# Patient Record
Sex: Female | Born: 1935 | Race: White | Hispanic: No | Smoking: Former smoker
Health system: Southern US, Community
[De-identification: ages and names within clinical notes are randomized; demographics above are authoritative.]

## PROBLEM LIST (undated history)

## (undated) DIAGNOSIS — F0390 Unspecified dementia without behavioral disturbance: Secondary | ICD-10-CM

## (undated) DIAGNOSIS — I10 Essential (primary) hypertension: Secondary | ICD-10-CM

## (undated) DIAGNOSIS — H409 Unspecified glaucoma: Secondary | ICD-10-CM

## (undated) DIAGNOSIS — F419 Anxiety disorder, unspecified: Secondary | ICD-10-CM

## (undated) DIAGNOSIS — M1990 Unspecified osteoarthritis, unspecified site: Secondary | ICD-10-CM

## (undated) HISTORY — PX: JOINT REPLACEMENT: SHX530

---

## 1998-01-25 ENCOUNTER — Other Ambulatory Visit: Admission: RE | Admit: 1998-01-25 | Discharge: 1998-01-25 | Payer: Self-pay | Admitting: Internal Medicine

## 2000-01-17 ENCOUNTER — Other Ambulatory Visit: Admission: RE | Admit: 2000-01-17 | Discharge: 2000-01-17 | Payer: Self-pay | Admitting: Internal Medicine

## 2000-06-13 ENCOUNTER — Ambulatory Visit (HOSPITAL_COMMUNITY): Admission: RE | Admit: 2000-06-13 | Discharge: 2000-06-13 | Payer: Self-pay | Admitting: *Deleted

## 2000-06-13 ENCOUNTER — Encounter (INDEPENDENT_AMBULATORY_CARE_PROVIDER_SITE_OTHER): Payer: Self-pay | Admitting: *Deleted

## 2002-05-10 ENCOUNTER — Encounter: Admission: RE | Admit: 2002-05-10 | Discharge: 2002-05-10 | Payer: Self-pay | Admitting: Internal Medicine

## 2002-05-10 ENCOUNTER — Encounter: Payer: Self-pay | Admitting: Internal Medicine

## 2002-06-08 ENCOUNTER — Encounter: Admission: RE | Admit: 2002-06-08 | Discharge: 2002-06-08 | Payer: Self-pay | Admitting: Obstetrics and Gynecology

## 2002-06-08 ENCOUNTER — Encounter: Payer: Self-pay | Admitting: Obstetrics and Gynecology

## 2002-07-05 ENCOUNTER — Encounter: Payer: Self-pay | Admitting: Internal Medicine

## 2002-07-05 ENCOUNTER — Encounter: Admission: RE | Admit: 2002-07-05 | Discharge: 2002-07-05 | Payer: Self-pay | Admitting: Internal Medicine

## 2003-06-14 ENCOUNTER — Encounter: Admission: RE | Admit: 2003-06-14 | Discharge: 2003-06-14 | Payer: Self-pay | Admitting: Internal Medicine

## 2004-06-19 ENCOUNTER — Encounter: Admission: RE | Admit: 2004-06-19 | Discharge: 2004-06-19 | Payer: Self-pay | Admitting: Family Medicine

## 2004-08-30 ENCOUNTER — Other Ambulatory Visit: Admission: RE | Admit: 2004-08-30 | Discharge: 2004-08-30 | Payer: Self-pay | Admitting: Obstetrics and Gynecology

## 2005-06-20 ENCOUNTER — Encounter: Admission: RE | Admit: 2005-06-20 | Discharge: 2005-06-20 | Payer: Self-pay | Admitting: Family Medicine

## 2005-10-31 ENCOUNTER — Encounter: Admission: RE | Admit: 2005-10-31 | Discharge: 2005-10-31 | Payer: Self-pay | Admitting: *Deleted

## 2006-07-08 ENCOUNTER — Encounter: Admission: RE | Admit: 2006-07-08 | Discharge: 2006-07-08 | Payer: Self-pay | Admitting: *Deleted

## 2007-07-14 ENCOUNTER — Encounter: Admission: RE | Admit: 2007-07-14 | Discharge: 2007-07-14 | Payer: Self-pay | Admitting: Family

## 2009-06-09 ENCOUNTER — Encounter: Admission: RE | Admit: 2009-06-09 | Discharge: 2009-06-09 | Payer: Self-pay | Admitting: Family Medicine

## 2009-06-09 ENCOUNTER — Encounter (INDEPENDENT_AMBULATORY_CARE_PROVIDER_SITE_OTHER): Payer: Self-pay | Admitting: *Deleted

## 2010-05-03 NOTE — Procedures (Signed)
Summary: Colon   Colonoscopy  Procedure date:  06/13/2000  Findings:      Location:  [facility].                     [facility]  Patient:    Yvonne Beck, Yvonne Beck                        MRN: [id number] Adm. Date:  [id number] Attending:  Sabino Gasser                           Procedure Report  PROCEDURE:  Colonoscopy.  INDICATIONS:  Colon cancer screening.  ANESTHESIA:  Demerol 60 mg, Versed 6 mg were given intravenously in divided dose.  DESCRIPTION OF PROCEDURE:  With patient mildly sedated in the left lateral decubitus position, the Olympus videoscopic colonoscope was passed under direct vision after being inserted into the rectum.  We reached the cecum, cecum identified by the ileocecal valve and appendiceal orifice.  From this point, the colonoscope was slowly withdrawn, taking circumferential views of the entire colonic mucosa, stopping then only in the rectum, which appeared normal on direct view and showed internal hemorrhoids on retroflexed view. The endoscope was straightened and withdrawn.  The patients vital signs and pulse oximetry remained stable.  The patient tolerated the procedure well without apparent complications.  FINDINGS:  Internal hemorrhoids, otherwise unremarkable examination.  PLAN:  Follow up with me in five years or as needed. DD:  06/13/00 TD:  06/13/00 Job: [id number]    This report was created from the original endoscopy report, which was reviewed and signed by the above listed endoscopist.

## 2010-08-17 NOTE — Procedures (Signed)
[facility]  Patient:    Yvonne Beck, Yvonne Beck                        MRN: [id number] Adm. Date:  [id number] Attending:  Sabino Gasser                           Procedure Report  PROCEDURE:  Colonoscopy.  INDICATIONS:  Colon cancer screening.  ANESTHESIA:  Demerol 60 mg, Versed 6 mg were given intravenously in divided dose.  DESCRIPTION OF PROCEDURE:  With patient mildly sedated in the left lateral decubitus position, the Olympus videoscopic colonoscope was passed under direct vision after being inserted into the rectum.  We reached the cecum, cecum identified by the ileocecal valve and appendiceal orifice.  From this point, the colonoscope was slowly withdrawn, taking circumferential views of the entire colonic mucosa, stopping then only in the rectum, which appeared normal on direct view and showed internal hemorrhoids on retroflexed view. The endoscope was straightened and withdrawn.  The patients vital signs and pulse oximetry remained stable.  The patient tolerated the procedure well without apparent complications.  FINDINGS:  Internal hemorrhoids, otherwise unremarkable examination.  PLAN:  Follow up with me in five years or as needed. DD:  06/13/00 TD:  06/13/00 Job: [id number]

## 2011-12-08 ENCOUNTER — Emergency Department (HOSPITAL_COMMUNITY)
Admission: EM | Admit: 2011-12-08 | Discharge: 2011-12-08 | Disposition: A | Discharge status: Home / Self Care | Payer: Medicare HMO | Attending: Emergency Medicine | Admitting: Emergency Medicine

## 2011-12-08 ENCOUNTER — Encounter (HOSPITAL_COMMUNITY): Payer: Self-pay | Admitting: *Deleted

## 2011-12-08 DIAGNOSIS — S41119A Laceration without foreign body of unspecified upper arm, initial encounter: Secondary | ICD-10-CM

## 2011-12-08 DIAGNOSIS — W2209XA Striking against other stationary object, initial encounter: Secondary | ICD-10-CM | POA: Insufficient documentation

## 2011-12-08 DIAGNOSIS — S51809A Unspecified open wound of unspecified forearm, initial encounter: Secondary | ICD-10-CM | POA: Insufficient documentation

## 2011-12-08 HISTORY — DX: Essential (primary) hypertension: I10

## 2011-12-08 NOTE — ED Notes (Signed)
Pt alert and oriented x4. Respirations even and unlabored, bilateral symmetrical rise and fall of chest. Skin warm and dry. In no acute distress. Denies needs.   

## 2011-12-08 NOTE — ED Provider Notes (Addendum)
History     CSN: [id number]  Arrival date & time 12/08/11  1706   First MD Initiated Contact with Patient 12/08/11 1851      Chief Complaint  Patient presents with  . Fall  . Arm Injury     HPI The patient presents the emergency room after injuring her right arm. Patient states she fell and dragged her right forearm on the window seal. She sustained a small laceration.  Patient states is only sore denies any numbness or weakness. She does not feel weak or dizzy. She did not strike her head or lose consciousness. She has not had any fevers or cough no chest pain or abdominal pain. Patient states she otherwise feels fine Past Medical History  Diagnosis Date  . Hypertension     History reviewed. No pertinent past surgical history.  History reviewed. No pertinent family history.  History  Substance Use Topics  . Smoking status: Former Games developer  . Smokeless tobacco: Not on file  . Alcohol Use: No    OB History    Grav Para Term Preterm Abortions TAB SAB Ect Mult Living                  Review of Systems  All other systems reviewed and are negative.    Allergies  Review of patient's allergies indicates no known allergies.  Home Medications   Current Outpatient Rx  Name Route Sig Dispense Refill  . ASPIRIN EC 81 MG PO TBEC Oral Take 81 mg by mouth daily.      BP 191/83  Pulse 80  Temp 98.1 F (36.7 C) (Oral)  SpO2 94%  Physical Exam  Nursing note and vitals reviewed. Constitutional: She appears well-developed and well-nourished. No distress.  HENT:  Head: Normocephalic and atraumatic.  Right Ear: External ear normal.  Left Ear: External ear normal.  Eyes: Conjunctivae are normal. Right eye exhibits no discharge. Left eye exhibits no discharge. No scleral icterus.  Neck: Neck supple. No tracheal deviation present.  Cardiovascular: Normal rate, regular rhythm and intact distal pulses.   Pulmonary/Chest: Effort normal and breath sounds normal. No stridor. No  respiratory distress. She has no wheezes. She has no rales.  Abdominal: Soft. Bowel sounds are normal. She exhibits no distension. There is no tenderness. There is no rebound and no guarding.  Musculoskeletal: She exhibits no edema and no tenderness.       Superficial laceration right mid forearm dorsal aspect, distal neurovascular intact, and appears to be superficial  Neurological: She is alert. She has normal strength. No sensory deficit. Cranial nerve deficit:  no gross defecits noted. She exhibits normal muscle tone. She displays no seizure activity. Coordination normal.  Skin: Skin is warm and dry. No rash noted.  Psychiatric: She has a normal mood and affect.    ED Course  LACERATION REPAIR Performed by: Celene Kras Authorized by: Linwood Dibbles R Consent: Verbal consent obtained. Body area: upper extremity Location details: right lower arm Laceration length: 5 cm Tendon involvement: none Nerve involvement: none Vascular damage: no Irrigation solution: saline Amount of cleaning: standard Debridement: none Degree of undermining: none Skin closure: glue Approximation: close Approximation difficulty: simple   No results found.   1. Laceration of arm       MDM  Patient without signs of serious injury. Her laceration was repaired with Dermabond. The patient denies any significant bony tenderness. X-rays deferred  At this time there does not appear to be any evidence of an  acute emergency medical condition and the patient appears stable for discharge with appropriate outpatient follow up.   Celene Kras, MD 12/08/11 Rosamaria Lints

## 2011-12-08 NOTE — ED Notes (Addendum)
Pt reports she was standing and she fell onto her window sill. Pt has fallen 3 times before. Pt fell and dragged right forearm on window seal. Pt has 5 inch vertical laceration down outside of forearm. Pt reports "arm is sore" but will not give a number for pain. Bruising noted along forearm. Pt denies numbness/ tingling. Radial pulse bil strong. Full rom.

## 2012-01-28 ENCOUNTER — Encounter (HOSPITAL_COMMUNITY): Payer: Self-pay | Admitting: *Deleted

## 2012-01-29 ENCOUNTER — Encounter (HOSPITAL_COMMUNITY): Payer: Self-pay | Admitting: Pharmacy Technician

## 2012-02-09 NOTE — H&P (Signed)
TOTAL HIP ADMISSION H&P  Patient is admitted for right total hip arthroplasty.  Subjective:  Chief Complaint: right hip pain  HPI: Yvonne Beck, 78 y.o. female, has a history of pain and functional disability in the right hip(s) due to arthritis and patient has failed non-surgical conservative treatments for greater than 12 weeks to include NSAID's and/or analgesics, use of assistive devices and activity modification.  Onset of symptoms was gradual starting 2 years ago with gradually worsening course since that time.The patient noted no past surgery on the right hip(s).  Patient currently rates pain in the right hip at 8 out of 10 with activity. Patient has night pain, worsening of pain with activity and weight bearing, pain that interfers with activities of daily living and pain with passive range of motion. Patient has evidence of subchondral cysts, subchondral sclerosis, periarticular osteophytes and joint space narrowing by imaging studies. This condition presents safety issues increasing the risk of falls. There is no current active infection.   Past Medical History  Diagnosis Date  . Hypertension   . Arthritis   Glaucoma Measles Mumps Scarlet fever  Past Surgical History  Procedure Date  . No past surgeries     Current outpatient prescriptions:aspirin EC 81 MG tablet, Take 81 mg by mouth daily.,    fluticasone (FLONASE) 50 MCG/ACT nasal spray, Place 2 sprays into the nose daily.,    latanoprost (XALATAN) 0.005 % ophthalmic solution, Place 1 drop into the right eye every morning., lisinopril (PRINIVIL,ZESTRIL) 40 MG tablet, Take 40 mg by mouth daily.,  timolol (TIMOPTIC-XR) 0.25 % ophthalmic gel-forming, Place 1 drop into the right eye every morning.,    No Known Allergies  History  Substance Use Topics  . Smoking status: Former Games developer  . Smokeless tobacco: Not on file  . Alcohol Use: No    Family History Mother deceased in her 35s from a stroke Father deceased age 3  from heart failure Sister deceased age 35 from a stroke  Review of Systems  Constitutional: Positive for malaise/fatigue. Negative for fever, chills, weight loss and diaphoresis.  HENT: Negative.  Negative for neck pain.   Eyes: Negative.   Respiratory: Negative.   Cardiovascular: Negative.   Gastrointestinal: Negative.   Genitourinary: Positive for frequency. Negative for dysuria, urgency, hematuria and flank pain.       At night  Musculoskeletal: Positive for joint pain. Negative for myalgias, back pain and falls.       Right hip pain  Skin: Negative.   Neurological: Negative.  Negative for weakness.  Endo/Heme/Allergies: Negative.   Psychiatric/Behavioral: Negative.     Objective:  Physical Exam  Constitutional: She is oriented to person, place, and time. She appears well-developed and well-nourished. No distress.  HENT:  Head: Normocephalic and atraumatic.  Right Ear: External ear normal.  Left Ear: External ear normal.  Nose: Nose normal.  Mouth/Throat: Oropharynx is clear and moist.  Eyes: EOM are normal.  Neck: Normal range of motion. Neck supple. No thyromegaly present.  Cardiovascular: Normal rate, regular rhythm, normal heart sounds and intact distal pulses.   No murmur heard. Respiratory: Effort normal. No respiratory distress. She has wheezes. She exhibits no tenderness.       Mild inspiratory wheeze in all lung fields  GI: Soft. Bowel sounds are normal. She exhibits no distension and no mass. There is no tenderness.  Musculoskeletal:       Right hip: She exhibits decreased range of motion. She exhibits no tenderness.  Left hip: Normal.       Right knee: Normal.       Left knee: Normal.       Legs: Lymphadenopathy:    She has no cervical adenopathy.  Neurological: She is alert and oriented to person, place, and time. She has normal strength. No sensory deficit.  Skin: No rash noted. She is not diaphoretic. No erythema.  Psychiatric: She has a normal  mood and affect.    Vitals BP: 140/80 HR: 64 bpm RR: 16   Imaging Review Plain radiographs demonstrate severe degenerative joint disease of the right hip(s). The bone quality appears to be fair for age and reported activity level.  Assessment/Plan:  End stage arthritis, right hip(s)  The patient history, physical examination, clinical judgement of the provider and imaging studies are consistent with end stage degenerative joint disease of the right hip(s) and total hip arthroplasty is deemed medically necessary. The treatment options including medical management, injection therapy, arthroscopy and arthroplasty were discussed at length. The risks and benefits of total hip arthroplasty were presented and reviewed. The risks due to aseptic loosening, infection, stiffness, dislocation/subluxation,  thromboembolic complications and other imponderables were discussed.  The patient acknowledged the explanation, agreed to proceed with the plan and consent was signed. Patient is being admitted for inpatient treatment for surgery, pain control, PT, OT, prophylactic antibiotics, VTE prophylaxis, progressive ambulation and ADL's and discharge planning.The patient is planning to be discharged home with home health services.     Dimitri Ped, PA-C

## 2012-02-10 ENCOUNTER — Encounter (HOSPITAL_COMMUNITY): Payer: Self-pay | Admitting: Anesthesiology

## 2012-02-10 ENCOUNTER — Inpatient Hospital Stay (HOSPITAL_COMMUNITY): Payer: Medicare HMO | Admitting: Anesthesiology

## 2012-02-10 ENCOUNTER — Inpatient Hospital Stay (HOSPITAL_COMMUNITY): Payer: Medicare HMO

## 2012-02-10 ENCOUNTER — Encounter (HOSPITAL_COMMUNITY): Payer: Self-pay | Admitting: *Deleted

## 2012-02-10 ENCOUNTER — Inpatient Hospital Stay (HOSPITAL_COMMUNITY)
Admission: RE | Admit: 2012-02-10 | Discharge: 2012-02-14 | DRG: 470 | Disposition: A | Discharge status: Skilled Nursing Facility | Payer: Medicare HMO | Source: Ambulatory Visit | Attending: Orthopedic Surgery | Admitting: Orthopedic Surgery

## 2012-02-10 ENCOUNTER — Encounter (HOSPITAL_COMMUNITY)
Admission: RE | Disposition: A | Discharge status: Skilled Nursing Facility | Payer: Self-pay | Source: Ambulatory Visit | Attending: Orthopedic Surgery

## 2012-02-10 DIAGNOSIS — D62 Acute posthemorrhagic anemia: Secondary | ICD-10-CM | POA: Diagnosis not present

## 2012-02-10 DIAGNOSIS — M87059 Idiopathic aseptic necrosis of unspecified femur: Secondary | ICD-10-CM | POA: Diagnosis present

## 2012-02-10 DIAGNOSIS — Z96649 Presence of unspecified artificial hip joint: Secondary | ICD-10-CM

## 2012-02-10 DIAGNOSIS — R339 Retention of urine, unspecified: Secondary | ICD-10-CM | POA: Diagnosis not present

## 2012-02-10 DIAGNOSIS — I10 Essential (primary) hypertension: Secondary | ICD-10-CM | POA: Diagnosis present

## 2012-02-10 HISTORY — PX: TOTAL HIP ARTHROPLASTY: SHX124

## 2012-02-10 HISTORY — DX: Unspecified osteoarthritis, unspecified site: M19.90

## 2012-02-10 LAB — SURGICAL PCR SCREEN: MRSA, PCR: NEGATIVE

## 2012-02-10 LAB — PROTIME-INR
INR: 1.01 (ref 0.00–1.49)
Prothrombin Time: 13.2 seconds (ref 11.6–15.2)

## 2012-02-10 LAB — ABO/RH: ABO/RH(D): B POS

## 2012-02-10 LAB — APTT: aPTT: 26 seconds (ref 24–37)

## 2012-02-10 LAB — TYPE AND SCREEN
ABO/RH(D): B POS
Antibody Screen: NEGATIVE

## 2012-02-10 SURGERY — ARTHROPLASTY, HIP, TOTAL,POSTERIOR APPROACH
Anesthesia: General | Site: Hip | Laterality: Right | Wound class: Clean

## 2012-02-10 MED ORDER — HYDROMORPHONE HCL PF 1 MG/ML IJ SOLN
INTRAMUSCULAR | Status: DC | PRN
  Administered 2012-02-10 (×3): 0.5 mg via INTRAVENOUS

## 2012-02-10 MED ORDER — BISACODYL 10 MG RE SUPP
10.0000 mg | Freq: Every day | RECTAL | Status: DC | PRN
  Administered 2012-02-14: 10 mg via RECTAL
  Filled 2012-02-10: qty 1

## 2012-02-10 MED ORDER — SODIUM CHLORIDE 0.9 % IR SOLN
Status: DC | PRN
  Administered 2012-02-10: 1000 mL

## 2012-02-10 MED ORDER — HYDROMORPHONE HCL PF 1 MG/ML IJ SOLN
1.0000 mg | INTRAMUSCULAR | Status: DC | PRN
  Administered 2012-02-11: 1 mg via INTRAVENOUS
  Filled 2012-02-10: qty 1

## 2012-02-10 MED ORDER — ACETAMINOPHEN 10 MG/ML IV SOLN
INTRAVENOUS | Status: DC | PRN
  Administered 2012-02-10: 1000 mg via INTRAVENOUS

## 2012-02-10 MED ORDER — NEOSTIGMINE METHYLSULFATE 1 MG/ML IJ SOLN
INTRAMUSCULAR | Status: DC | PRN
  Administered 2012-02-10: 3 mg via INTRAVENOUS

## 2012-02-10 MED ORDER — FENTANYL CITRATE 0.05 MG/ML IJ SOLN
INTRAMUSCULAR | Status: DC | PRN
  Administered 2012-02-10: 50 ug via INTRAVENOUS
  Administered 2012-02-10 (×2): 25 ug via INTRAVENOUS
  Administered 2012-02-10 (×2): 50 ug via INTRAVENOUS
  Administered 2012-02-10 (×2): 25 ug via INTRAVENOUS

## 2012-02-10 MED ORDER — BUPIVACAINE LIPOSOME 1.3 % IJ SUSP
20.0000 mL | Freq: Once | INTRAMUSCULAR | Status: AC
  Administered 2012-02-10: 20 mL
  Filled 2012-02-10: qty 20

## 2012-02-10 MED ORDER — FLUTICASONE PROPIONATE 50 MCG/ACT NA SUSP
2.0000 | Freq: Every day | NASAL | Status: DC
  Administered 2012-02-11 – 2012-02-14 (×4): 2 via NASAL
  Filled 2012-02-10: qty 16

## 2012-02-10 MED ORDER — ONDANSETRON HCL 4 MG/2ML IJ SOLN
INTRAMUSCULAR | Status: DC | PRN
  Administered 2012-02-10: 2 mg via INTRAVENOUS

## 2012-02-10 MED ORDER — TIMOLOL MALEATE 0.25 % OP SOLG
1.0000 [drp] | Freq: Every morning | OPHTHALMIC | Status: DC
  Administered 2012-02-11 – 2012-02-14 (×4): 1 [drp] via OPHTHALMIC
  Filled 2012-02-10: qty 5

## 2012-02-10 MED ORDER — MUPIROCIN 2 % EX OINT: TOPICAL_OINTMENT | Freq: Two times a day (BID) | CUTANEOUS | Status: DC

## 2012-02-10 MED ORDER — GLYCOPYRROLATE 0.2 MG/ML IJ SOLN
INTRAMUSCULAR | Status: DC | PRN
  Administered 2012-02-10: 0.2 mg via INTRAVENOUS
  Administered 2012-02-10: 0.4 mg via INTRAVENOUS

## 2012-02-10 MED ORDER — METHOCARBAMOL 500 MG PO TABS
500.0000 mg | ORAL_TABLET | Freq: Four times a day (QID) | ORAL | Status: DC | PRN
  Administered 2012-02-11 – 2012-02-14 (×4): 500 mg via ORAL
  Filled 2012-02-10 (×4): qty 1

## 2012-02-10 MED ORDER — FLEET ENEMA 7-19 GM/118ML RE ENEM: 1.0000 | ENEMA | Freq: Once | RECTAL | Status: AC | PRN

## 2012-02-10 MED ORDER — LISINOPRIL 40 MG PO TABS
40.0000 mg | ORAL_TABLET | Freq: Every day | ORAL | Status: DC
  Administered 2012-02-11 – 2012-02-14 (×4): 40 mg via ORAL
  Filled 2012-02-10 (×4): qty 1

## 2012-02-10 MED ORDER — THROMBIN 5000 UNITS EX SOLR
OROMUCOSAL | Status: DC | PRN
  Administered 2012-02-10: 18:00:00 via TOPICAL

## 2012-02-10 MED ORDER — LIDOCAINE HCL (CARDIAC) 20 MG/ML IV SOLN
INTRAVENOUS | Status: DC | PRN
  Administered 2012-02-10: 20 mg via INTRAVENOUS

## 2012-02-10 MED ORDER — PHENYLEPHRINE HCL 10 MG/ML IJ SOLN
INTRAMUSCULAR | Status: DC | PRN
  Administered 2012-02-10 (×3): 20 ug via INTRAVENOUS

## 2012-02-10 MED ORDER — METHOCARBAMOL 100 MG/ML IJ SOLN
500.0000 mg | Freq: Four times a day (QID) | INTRAMUSCULAR | Status: DC | PRN
  Filled 2012-02-10: qty 5

## 2012-02-10 MED ORDER — LACTATED RINGERS IV SOLN
INTRAVENOUS | Status: DC
  Administered 2012-02-11 – 2012-02-12 (×3): via INTRAVENOUS

## 2012-02-10 MED ORDER — SODIUM CHLORIDE 0.9 % IR SOLN
Status: DC | PRN
  Administered 2012-02-10: 17:00:00

## 2012-02-10 MED ORDER — CISATRACURIUM BESYLATE (PF) 10 MG/5ML IV SOLN
INTRAVENOUS | Status: DC | PRN
  Administered 2012-02-10: 5 mg via INTRAVENOUS

## 2012-02-10 MED ORDER — ACETAMINOPHEN 325 MG PO TABS
650.0000 mg | ORAL_TABLET | Freq: Four times a day (QID) | ORAL | Status: DC | PRN
  Administered 2012-02-11 – 2012-02-12 (×2): 650 mg via ORAL
  Administered 2012-02-12: 325 mg via ORAL
  Administered 2012-02-13 – 2012-02-14 (×3): 650 mg via ORAL
  Filled 2012-02-10 (×3): qty 2
  Filled 2012-02-10: qty 1
  Filled 2012-02-10 (×2): qty 2

## 2012-02-10 MED ORDER — MEPERIDINE HCL 50 MG/ML IJ SOLN: 6.2500 mg | INTRAMUSCULAR | Status: DC | PRN

## 2012-02-10 MED ORDER — POLYETHYLENE GLYCOL 3350 17 G PO PACK: 17.0000 g | PACK | Freq: Every day | ORAL | Status: DC | PRN

## 2012-02-10 MED ORDER — CHLORHEXIDINE GLUCONATE 4 % EX LIQD
60.0000 mL | Freq: Once | CUTANEOUS | Status: DC
  Filled 2012-02-10: qty 60

## 2012-02-10 MED ORDER — LATANOPROST 0.005 % OP SOLN
1.0000 [drp] | Freq: Every morning | OPHTHALMIC | Status: DC
  Administered 2012-02-11 – 2012-02-14 (×4): 1 [drp] via OPHTHALMIC
  Filled 2012-02-10: qty 2.5

## 2012-02-10 MED ORDER — FERROUS SULFATE 325 (65 FE) MG PO TABS
325.0000 mg | ORAL_TABLET | Freq: Three times a day (TID) | ORAL | Status: DC
  Administered 2012-02-11 – 2012-02-14 (×9): 325 mg via ORAL
  Filled 2012-02-10 (×13): qty 1

## 2012-02-10 MED ORDER — EPHEDRINE SULFATE 50 MG/ML IJ SOLN
INTRAMUSCULAR | Status: DC | PRN
  Administered 2012-02-10 (×3): 5 mg via INTRAVENOUS

## 2012-02-10 MED ORDER — ACETAMINOPHEN 650 MG RE SUPP: 650.0000 mg | Freq: Four times a day (QID) | RECTAL | Status: DC | PRN

## 2012-02-10 MED ORDER — ESMOLOL HCL 10 MG/ML IV SOLN
INTRAVENOUS | Status: DC | PRN
  Administered 2012-02-10 (×2): 10 mg via INTRAVENOUS

## 2012-02-10 MED ORDER — SUCCINYLCHOLINE CHLORIDE 20 MG/ML IJ SOLN
INTRAMUSCULAR | Status: DC | PRN
  Administered 2012-02-10: 100 mg via INTRAVENOUS

## 2012-02-10 MED ORDER — PHENOL 1.4 % MT LIQD
1.0000 | OROMUCOSAL | Status: DC | PRN
  Filled 2012-02-10: qty 177

## 2012-02-10 MED ORDER — MUPIROCIN 2 % EX OINT
TOPICAL_OINTMENT | CUTANEOUS | Status: AC
  Administered 2012-02-10: 1 via NASAL
  Filled 2012-02-10: qty 22

## 2012-02-10 MED ORDER — LACTATED RINGERS IV SOLN
INTRAVENOUS | Status: DC | PRN
  Administered 2012-02-10 (×2): via INTRAVENOUS

## 2012-02-10 MED ORDER — ONDANSETRON HCL 4 MG PO TABS: 4.0000 mg | ORAL_TABLET | Freq: Four times a day (QID) | ORAL | Status: DC | PRN

## 2012-02-10 MED ORDER — OXYCODONE-ACETAMINOPHEN 5-325 MG PO TABS
2.0000 | ORAL_TABLET | ORAL | Status: DC | PRN
  Administered 2012-02-12 – 2012-02-13 (×2): 2 via ORAL
  Filled 2012-02-10 (×3): qty 2

## 2012-02-10 MED ORDER — HYDROCODONE-ACETAMINOPHEN 5-325 MG PO TABS
1.0000 | ORAL_TABLET | ORAL | Status: DC | PRN
  Administered 2012-02-11 (×3): 1 via ORAL
  Filled 2012-02-10 (×3): qty 1

## 2012-02-10 MED ORDER — HETASTARCH-ELECTROLYTES 6 % IV SOLN
INTRAVENOUS | Status: DC | PRN
  Administered 2012-02-10: 18:00:00 via INTRAVENOUS

## 2012-02-10 MED ORDER — OXYCODONE HCL 5 MG PO TABS: 5.0000 mg | ORAL_TABLET | Freq: Once | ORAL | Status: DC | PRN

## 2012-02-10 MED ORDER — BACITRACIN ZINC 500 UNIT/GM EX OINT
TOPICAL_OINTMENT | CUTANEOUS | Status: DC | PRN
  Administered 2012-02-10: 1 via TOPICAL

## 2012-02-10 MED ORDER — CEFAZOLIN SODIUM 1-5 GM-% IV SOLN
1.0000 g | Freq: Four times a day (QID) | INTRAVENOUS | Status: AC
  Administered 2012-02-10 – 2012-02-11 (×2): 1 g via INTRAVENOUS
  Filled 2012-02-10 (×2): qty 50

## 2012-02-10 MED ORDER — HYDROMORPHONE HCL PF 1 MG/ML IJ SOLN
0.2500 mg | INTRAMUSCULAR | Status: DC | PRN
  Administered 2012-02-10 (×2): 0.5 mg via INTRAVENOUS

## 2012-02-10 MED ORDER — CEFAZOLIN SODIUM-DEXTROSE 2-3 GM-% IV SOLR
2.0000 g | INTRAVENOUS | Status: AC
  Administered 2012-02-10: 2 g via INTRAVENOUS

## 2012-02-10 MED ORDER — ONDANSETRON HCL 4 MG/2ML IJ SOLN
4.0000 mg | Freq: Four times a day (QID) | INTRAMUSCULAR | Status: DC | PRN
  Administered 2012-02-11: 4 mg via INTRAVENOUS
  Filled 2012-02-10: qty 2

## 2012-02-10 MED ORDER — LACTATED RINGERS IV SOLN
INTRAVENOUS | Status: DC
  Administered 2012-02-10: 1000 mL via INTRAVENOUS

## 2012-02-10 MED ORDER — MENTHOL 3 MG MT LOZG
1.0000 | LOZENGE | OROMUCOSAL | Status: DC | PRN
  Filled 2012-02-10: qty 9

## 2012-02-10 MED ORDER — PROPOFOL 10 MG/ML IV EMUL
INTRAVENOUS | Status: DC | PRN
  Administered 2012-02-10: 110 mg via INTRAVENOUS

## 2012-02-10 MED ORDER — RIVAROXABAN 10 MG PO TABS
10.0000 mg | ORAL_TABLET | Freq: Every day | ORAL | Status: DC
  Administered 2012-02-11 – 2012-02-12 (×2): 10 mg via ORAL
  Filled 2012-02-10 (×4): qty 1

## 2012-02-10 MED ORDER — ALUM & MAG HYDROXIDE-SIMETH 200-200-20 MG/5ML PO SUSP: 30.0000 mL | ORAL | Status: DC | PRN

## 2012-02-10 MED ORDER — PROMETHAZINE HCL 25 MG/ML IJ SOLN
6.2500 mg | INTRAMUSCULAR | Status: DC | PRN
Start: 2012-02-10 — End: 2012-02-10

## 2012-02-10 MED ORDER — OXYCODONE HCL 5 MG/5ML PO SOLN
5.0000 mg | Freq: Once | ORAL | Status: DC | PRN
  Filled 2012-02-10: qty 5

## 2012-02-10 MED ORDER — ACETAMINOPHEN 10 MG/ML IV SOLN: 1000.0000 mg | Freq: Once | INTRAVENOUS | Status: DC | PRN

## 2012-02-10 SURGICAL SUPPLY — 52 items
BAG SPEC THK2 15X12 ZIP CLS (MISCELLANEOUS) ×1
BAG ZIPLOCK 12X15 (MISCELLANEOUS) ×2 IMPLANT
BLADE SAW SAG 73X25 THK (BLADE) ×1
BLADE SAW SGTL 73X25 THK (BLADE) ×1 IMPLANT
CLOTH BEACON ORANGE TIMEOUT ST (SAFETY) ×2 IMPLANT
DRAPE INCISE IOBAN 66X45 STRL (DRAPES) ×2 IMPLANT
DRAPE INCISE IOBAN 85X60 (DRAPES) ×2 IMPLANT
DRAPE ORTHO SPLIT 77X108 STRL (DRAPES) ×4
DRAPE POUCH INSTRU U-SHP 10X18 (DRAPES) ×2 IMPLANT
DRAPE SURG 17X11 SM STRL (DRAPES) ×2 IMPLANT
DRAPE SURG ORHT 6 SPLT 77X108 (DRAPES) ×2 IMPLANT
DRAPE U-SHAPE 47X51 STRL (DRAPES) ×2 IMPLANT
DRSG EMULSION OIL 3X16 NADH (GAUZE/BANDAGES/DRESSINGS) ×2 IMPLANT
DRSG PAD ABDOMINAL 8X10 ST (GAUZE/BANDAGES/DRESSINGS) ×3 IMPLANT
DRSG VASELINE 3X18 (GAUZE/BANDAGES/DRESSINGS) ×1 IMPLANT
DURAPREP 26ML APPLICATOR (WOUND CARE) ×2 IMPLANT
ELECT BLADE TIP CTD 4 INCH (ELECTRODE) ×2 IMPLANT
ELECT REM PT RETURN 9FT ADLT (ELECTROSURGICAL) ×2
ELECTRODE REM PT RTRN 9FT ADLT (ELECTROSURGICAL) ×1 IMPLANT
EVACUATOR 1/8 PVC DRAIN (DRAIN) ×2 IMPLANT
FACESHIELD LNG OPTICON STERILE (SAFETY) ×12 IMPLANT
FLOSEAL 10ML (HEMOSTASIS) ×2 IMPLANT
GLOVE BIOGEL M 6.5 STRL (GLOVE) ×3 IMPLANT
GLOVE BIOGEL PI IND STRL 8 (GLOVE) ×1 IMPLANT
GLOVE BIOGEL PI IND STRL 8.5 (GLOVE) ×1 IMPLANT
GLOVE BIOGEL PI INDICATOR 8 (GLOVE) ×1
GLOVE BIOGEL PI INDICATOR 8.5 (GLOVE) ×1
GLOVE ECLIPSE 8.0 STRL XLNG CF (GLOVE) ×8 IMPLANT
GOWN PREVENTION PLUS LG XLONG (DISPOSABLE) ×5 IMPLANT
GOWN STRL REIN XL XLG (GOWN DISPOSABLE) ×5 IMPLANT
IMMOBILIZER KNEE 20 (SOFTGOODS)
IMMOBILIZER KNEE 20 THIGH 36 (SOFTGOODS) IMPLANT
KIT BASIN OR (CUSTOM PROCEDURE TRAY) ×2 IMPLANT
MANIFOLD NEPTUNE II (INSTRUMENTS) ×2 IMPLANT
NEEDLE HYPO 22GX1.5 SAFETY (NEEDLE) ×2 IMPLANT
PACK TOTAL JOINT (CUSTOM PROCEDURE TRAY) ×2 IMPLANT
POSITIONER SURGICAL ARM (MISCELLANEOUS) ×2 IMPLANT
SPONGE GAUZE 4X4 12PLY (GAUZE/BANDAGES/DRESSINGS) ×1 IMPLANT
SPONGE LAP 18X18 X RAY DECT (DISPOSABLE) ×2 IMPLANT
SPONGE LAP 4X18 X RAY DECT (DISPOSABLE) ×2 IMPLANT
SPONGE SURGIFOAM ABS GEL 100 (HEMOSTASIS) ×2 IMPLANT
STAPLER VISISTAT 35W (STAPLE) ×2 IMPLANT
SUCTION FRAZIER TIP 10 FR DISP (SUCTIONS) ×2 IMPLANT
SUT VIC AB 0 CT1 27 (SUTURE) ×8
SUT VIC AB 0 CT1 27XBRD ANTBC (SUTURE) ×2 IMPLANT
SUT VIC AB 1 CT1 27 (SUTURE) ×10
SUT VIC AB 1 CT1 27XBRD ANTBC (SUTURE) ×5 IMPLANT
SYR 20CC LL (SYRINGE) ×2 IMPLANT
TAPE CLOTH SURG 4X10 WHT LF (GAUZE/BANDAGES/DRESSINGS) ×1 IMPLANT
TOWEL OR 17X26 10 PK STRL BLUE (TOWEL DISPOSABLE) ×4 IMPLANT
TRAY FOLEY CATH 14FRSI W/METER (CATHETERS) ×2 IMPLANT
WATER STERILE IRR 1500ML POUR (IV SOLUTION) ×2 IMPLANT

## 2012-02-10 NOTE — Transfer of Care (Signed)
Immediate Anesthesia Transfer of Care Note  Patient: Yvonne Beck  Procedure(s) Performed: Procedure(s) (LRB) with comments: TOTAL HIP ARTHROPLASTY (Right)  Patient Location: PACU  Anesthesia Type:General  Level of Consciousness: awake and alert   Airway & Oxygen Therapy: Patient Spontanous Breathing and Patient connected to face mask oxygen  Post-op Assessment: Report given to PACU RN, Post -op Vital signs reviewed and stable and Patient moving all extremities X 4  Post vital signs: Reviewed and stable  Complications: No apparent anesthesia complications

## 2012-02-10 NOTE — Anesthesia Postprocedure Evaluation (Signed)
Anesthesia Post Note  Patient: Yvonne Beck  Procedure(s) Performed: Procedure(s) (LRB): TOTAL HIP ARTHROPLASTY (Right)  Anesthesia type: General  Patient location: PACU  Post pain: Pain level controlled  Post assessment: Post-op Vital signs reviewed  Last Vitals: BP 137/65  Pulse 59  Temp 36.3 C  Resp 11  Ht 5\' 4"  (1.626 m)  Wt 130 lb (58.968 kg)  BMI 22.31 kg/m2  SpO2 100%  Post vital signs: Reviewed  Level of consciousness: sedated  Complications: No apparent anesthesia complications

## 2012-02-10 NOTE — Interval H&P Note (Signed)
History and Physical Interval Note:  02/10/2012 4:22 PM  Yvonne Beck  has presented today for surgery, with the diagnosis of Osteoarthritis RIGHT HIP  The various methods of treatment have been discussed with the patient and family. After consideration of risks, benefits and other options for treatment, the patient has consented to  Procedure(s) (LRB) with comments: TOTAL HIP ARTHROPLASTY (Right) as a surgical intervention .  The patient's history has been reviewed, patient examined, no change in status, stable for surgery.  I have reviewed the patient's chart and labs.  Questions were answered to the patient's satisfaction.     Kin Galbraith A

## 2012-02-10 NOTE — Brief Op Note (Signed)
02/10/2012  6:47 PM  PATIENT:  Yvonne Beck  78 y.o. female  PRE-OPERATIVE DIAGNOSIS:Avascular Necrosis RIGHT HIP  POST-OPERATIVE DIAGNOSIS:Avascular Necrosis RIGHT HIP  PROCEDURE:  Procedure(s) (LRB) with comments: TOTAL HIP ARTHROPLASTY (Right)  SURGEON:  Surgeon(s) and Role:    * Jacki Cones, MD - Primary    * Shelda Pal, MD - Assisting  :   ASSISTANTS:Matthew Charlann Boxer MD}   ANESTHESIA:   general  EBL:  Total I/O In: 1000 [I.V.:1000] Out: 700 [Urine:500; Blood:200]  BLOOD ADMINISTERED:none  DRAINS: none   LOCAL MEDICATIONS USED:  BUPIVICAINE20cc mixed with 20cc of Normal Saline   SPECIMEN:  No Specimen  DISPOSITION OF SPECIMEN:  N/A  COUNTS:  YES  TOURNIQUET:  * No tourniquets in log *  DICTATION: .Other Dictation: Dictation Number [id number]  PLAN OF CARE: Admit to inpatient   PATIENT DISPOSITION:  PACU - hemodynamically stable.   Delay start of Pharmacological VTE agent (>24hrs) due to surgical blood loss or risk of bleeding: yes

## 2012-02-10 NOTE — Anesthesia Postprocedure Evaluation (Deleted)
Anesthesia Post Note  Patient: Yvonne Beck  Procedure(s) Performed: Procedure(s) (LRB): TOTAL HIP ARTHROPLASTY (Right)  Anesthesia type: General  Patient location: PACU  Post pain: Pain level controlled  Post assessment: Post-op Vital signs reviewed  Last Vitals: BP 153/56  Pulse 58  Temp 36 C  Resp 16  Ht 5\' 4"  (1.626 m)  Wt 130 lb (58.968 kg)  BMI 22.31 kg/m2  SpO2 100%  Post vital signs: Reviewed  Level of consciousness: sedated  Complications: No apparent anesthesia complications

## 2012-02-10 NOTE — Anesthesia Preprocedure Evaluation (Addendum)
Anesthesia Evaluation  Patient identified by MRN, date of birth, ID band Patient awake    Reviewed: Allergy & Precautions, H&P , NPO status , Patient's Chart, lab work & pertinent test results  Airway Mallampati: I TM Distance: >3 FB Neck ROM: Full    Dental  (+) Edentulous Upper and Dental Advisory Given   Pulmonary neg pulmonary ROS,  breath sounds clear to auscultation  Pulmonary exam normal       Cardiovascular hypertension, Pt. on medications Rhythm:Regular Rate:Normal     Neuro/Psych negative neurological ROS  negative psych ROS   GI/Hepatic negative GI ROS, Neg liver ROS,   Endo/Other  negative endocrine ROS  Renal/GU negative Renal ROS     Musculoskeletal negative musculoskeletal ROS (+)   Abdominal   Peds  Hematology negative hematology ROS (+)   Anesthesia Other Findings   Reproductive/Obstetrics                         Anesthesia Physical Anesthesia Plan  ASA: III  Anesthesia Plan: General   Post-op Pain Management:    Induction: Intravenous  Airway Management Planned: Oral ETT  Additional Equipment:   Intra-op Plan:   Post-operative Plan: Extubation in OR  Informed Consent: I have reviewed the patients History and Physical, chart, labs and discussed the procedure including the risks, benefits and alternatives for the proposed anesthesia with the patient or authorized representative who has indicated his/her understanding and acceptance.   Dental advisory given  Plan Discussed with: CRNA  Anesthesia Plan Comments:         Anesthesia Quick Evaluation

## 2012-02-11 DIAGNOSIS — D62 Acute posthemorrhagic anemia: Secondary | ICD-10-CM | POA: Diagnosis not present

## 2012-02-11 LAB — URINALYSIS, ROUTINE W REFLEX MICROSCOPIC
Glucose, UA: NEGATIVE mg/dL
Nitrite: NEGATIVE

## 2012-02-11 LAB — BASIC METABOLIC PANEL
CO2: 29 mEq/L (ref 19–32)
Calcium: 8.5 mg/dL (ref 8.4–10.5)
Creatinine, Ser: 1.25 mg/dL — ABNORMAL HIGH (ref 0.50–1.10)
Glucose, Bld: 108 mg/dL — ABNORMAL HIGH (ref 70–99)

## 2012-02-11 LAB — CBC
MCH: 31.8 pg (ref 26.0–34.0)
MCV: 96.5 fL (ref 78.0–100.0)
Platelets: 204 10*3/uL (ref 150–400)
RBC: 3.11 MIL/uL — ABNORMAL LOW (ref 3.87–5.11)

## 2012-02-11 NOTE — Op Note (Signed)
Yvonne Beck, Yvonne Beck NO.:  [id number]  MEDICAL RECORD NO.:  [id number]  LOCATION:  1607                         FACILITY:  [facility]  PHYSICIAN:  Georges Lynch. Marene Gilliam, M.D.DATE OF BIRTH:  07/07/1923  DATE OF PROCEDURE:  02/10/2012 DATE OF DISCHARGE:                              OPERATIVE REPORT   SURGEON:  Georges Lynch. Darrelyn Hillock, MD  ASSISTANT:  Madlyn Frankel. Charlann Boxer, MD  PREOPERATIVE DIAGNOSIS:  Avascular necrosis, right hip.  POSTOPERATIVE DIAGNOSIS:  Avascular necrosis, right hip.  OPERATION:  A right total hip arthroplasty utilizing DePuy system.  I utilized a high offset size 5 Tri-Lock stem.  The cup was a 52 mm cup with 1 screw and a hole eliminator.  The insert was a AltrX 36 mm inside diameter insert.  The head was a 36 mm.  Metal head +1.5.  PROCEDURE:  Under general anesthesia, the patient on left side, right side up, routine orthopedic prep and draping of the right hip was carried out.  She had 2 g of IV Ancef preop.  Appropriate time-out was carried out first.  At this particular time, prior to that in the holding area, I marked the appropriate right leg.  The posterolateral approach to the right hip was carried out in usual fashion.  The bleeders identified and cauterized.  Self-retaining retractors were inserted.  I then went down and made an incision through the iliotibial band and by blunt dissection separated portion of the gluteal muscle.  I then carried out a capsulectomy, dislocated the head, amputated the femoral head at the appropriate neck length.  At this particular time, I utilized the box osteotome to remove the cancellous bone from the trochanter.  I then utilized the widening reamer and then the [employer]. I then inserted the canal finer.  At this time, I then went on and rasped the femoral shaft up to a size 5 stem.  I then thoroughly irrigated out the stem of the femoral canal again and packed it with a large sponge which was later removed.  I  then completed my capsulectomy. I then reamed the acetabulum up to 51 for a 52 mm cup.  A 52 mm pinnacle cup was inserted with 1 screw and then the hole eliminator was inserted. We did check the position of the cup with the Charnley guide before inserting the screw.  At this time, we then irrigated out the cup and then inserted our AltrX cup +4 36 mm inside diameter.  I then removed the sponge from the femoral canal, irrigated the canal, and inserted high offset Tri-Lock stem.  We went through trial and elected to use the +1.5 ball, 36 mm diameter metal ball.  We then reduced the hip in the usual fashion, closed the wound layers in usual fashion.  I injected a mixture of 20 mL of Exparel with 20 mL of normal saline.  Note, we had excellent stability, excellent leg lengths.  We measured leg length all during the procedure to make sure we had good leg length.  Sterile dressings were applied.  The patient left the operating room in satisfactory condition.  ______________________________ Georges Lynch Darrelyn Hillock, M.D.     RAG/MEDQ  D:  02/10/2012  T:  02/11/2012  Job:  [id number]

## 2012-02-11 NOTE — Progress Notes (Signed)
Physical Therapy Treatment Note   02/11/12 1500  PT Visit Information  Last PT Received On 02/11/12  Assistance Needed +2  PT Time Calculation  PT Start Time 1535  PT Stop Time 1545  PT Time Calculation (min) 10 min  Subjective Data  Subjective I feel sick.  (upon standing)  Precautions  Precautions Posterior Hip;Fall  Restrictions  Weight Bearing Restrictions Yes  RLE Weight Bearing PWB  RLE Partial Weight Bearing Percentage or Pounds 50%  Cognition  Overall Cognitive Status Appears within functional limits for tasks assessed/performed  Bed Mobility  Bed Mobility Sit to Supine  Sit to Supine 3: Mod assist;HOB flat  Details for Bed Mobility Assistance assist for bil LEs and cues for precautions  Transfers  Transfers Stand to Sit;Sit to Stand;Stand Pivot Transfers  Sit to Stand 4: Min assist;With upper extremity assist;From chair/3-in-1  Stand to Sit 4: Min assist;With upper extremity assist;To bed  Details for Transfer Assistance assist to rise and control descent, also to steady upon standing due to lean against chair, verbal cues for safe technique and maintaining precautions, nsg tech assisted pt with donning undergarments and then pt began feeling nauseated so declined ambulated and assisted back to bed.  PT - End of Session  Activity Tolerance Patient limited by fatigue;Other (comment) (limited by nausea)  Patient left in bed;with call bell/phone within reach  Nurse Communication Other (comment) (pt nauseated)  PT - Assessment/Plan  Comments on Treatment Session Pt assisted to standing with nsg tech donning undergarments.  Pt became nauseated and declined further mobility so assisted back to bed.  Pt continued to feel nauseated so declined exercises as well.  RN aware of nausea.  PT Plan Discharge plan remains appropriate;Frequency remains appropriate  Follow Up Recommendations Home health PT;Supervision/Assistance - 24 hour (if no 24/7 assist, would benefit from SNF)    Equipment Recommended None recommended by PT  Acute Rehab PT Goals  PT Goal: Sit to Supine/Side - Progress Progressing toward goal  PT Goal: Sit to Stand - Progress Progressing toward goal  PT Goal: Stand to Sit - Progress Progressing toward goal  Additional Goals  PT Goal: Additional Goal #1 - Progress Progressing toward goal  PT General Charges  $$ ACUTE PT VISIT 1 Procedure  PT Treatments  $Therapeutic Activity 8-22 mins    Zenovia Jarred, PT Pager: [phone]

## 2012-02-11 NOTE — Clinical Documentation Improvement (Signed)
Anemia Blood Loss Clarification  THIS DOCUMENT IS NOT A PERMANENT PART OF THE MEDICAL RECORD  RESPOND TO THE THIS QUERY, FOLLOW THE INSTRUCTIONS BELOW:  1. If needed, update documentation for the patient's encounter via the notes activity.  2. Access this query again and click edit on the In [employer].  3. After updating, or not, click F2 to complete all highlighted (required) fields concerning your review. Select "additional documentation in the medical record" OR "no additional documentation provided".  4. Click Sign note button.  5. The deficiency will fall out of your In Basket *Please let us know if you are not able to complete this workflow by phone or e-mail (listed below).        02/11/12  Dear Joice Lofts Tamala Ser or Dr. Nilda Simmer  In an effort to better capture your patient's severity of illness, reflect appropriate length of stay and utilization of resources, a review of the patient medical record has revealed the following indicators.    Based on your clinical judgment, please clarify and document in a progress note and/or discharge summary the clinical condition associated with the following supporting information:  In responding to this query please exercise your independent judgment.  The fact that a query is asked, does not imply that any particular answer is desired or expected.  Based on your clinical judgment can you provide a diagnosis that represents the below listed clinical indicators?  In this patient admitted with End stage arthritis a review of the medical record reveals the following:  Pt had Right Total Hip Arthroplasty  EBL: 200  H/H=9.9/30.0  Treatment: Ferrous Sulfate  Please clarify if the abnormal H/H post op was due to one of the diagnoses listed below or any other condition and document in the pn and d/c summary.    Possible Clinical Conditions?   " Expected Acute Blood Loss Anemia  " Acute Blood Loss Anemia  " Acute on  chronic blood loss anemia   " Other Condition________________  " Cannot Clinically Determine  Risk Factors: (recent surgery, pre op anemia, EBL in OR)  Supporting Information:  Signs and Symptoms End stage arthritis, Avascular necrosis,  Diagnostics: Component     Latest Ref Rng 02/11/2012          Hemoglobin     12.0 - 15.0 g/dL 9.9 (L)  HCT     40.9 - 46.0 % 30.0 (L)   Treatments: See above  Reviewed: additional documentation in the medical record  Thank You,  Enis Slipper RN, BSN, MSN/Inf, CCDS Clinical Documentation Specialist [facility] Dept Pager: [phone] / E-mail: [email or web address]  Health Information Management [place]

## 2012-02-11 NOTE — Progress Notes (Signed)
OT Note:  Attempted to see pt but she wanted to visit with son.  I was not able to return before she went back to bed.  Will see pt tomorrow.  Marica Otter, OTR/L [phone] 02/11/2012

## 2012-02-11 NOTE — Progress Notes (Signed)
UR COMPLETED  

## 2012-02-11 NOTE — Evaluation (Signed)
Physical Therapy Evaluation Patient Details Name: Yvonne Beck MRN: [id number] DOB: 02-09-1924 Today's Date: 02/11/2012 Time: 4098-1191 PT Time Calculation (min): 15 min  PT Assessment / Plan / Recommendation Clinical Impression  Pt s/p R THR.  Pt would benefit from acute PT services in order to improve independence with transfers and ambulation while maintaining PWB and posterior hip precautions to prepare for d/c home with son.    PT Assessment  Patient needs continued PT services    Follow Up Recommendations  Home health PT;Supervision/Assistance - 24 hour    Does the patient have the potential to tolerate intense rehabilitation      Barriers to Discharge        Equipment Recommendations  None recommended by PT    Recommendations for Other Services     Frequency 7X/week    Precautions / Restrictions Precautions Precautions: Posterior Hip;Fall Precaution Comments: handout posted in room Restrictions Weight Bearing Restrictions: Yes RLE Weight Bearing: Partial weight bearing RLE Partial Weight Bearing Percentage or Pounds: 50%   Pertinent Vitals/Pain 6/10 R hip pain with mobility, premedicated and repositioned, pillow placed between knees to maintain precautions      Mobility  Bed Mobility Bed Mobility: Supine to Sit Supine to Sit: HOB elevated;4: Min assist Details for Bed Mobility Assistance: assist for R LE, increased verbal cues for technique and maintaining precautions Transfers Transfers: Stand to Sit;Sit to Stand Sit to Stand: 4: Min assist;With upper extremity assist;From bed Stand to Sit: 4: Min assist;With upper extremity assist;To chair/3-in-1 Details for Transfer Assistance: verbal cues for safe technique within precautions, pt required assist to sit due to uncontrolled descent and did not wait for verbal cues upon sitting Ambulation/Gait Ambulation/Gait Assistance: 4: Min assist Ambulation Distance (Feet): 40 Feet Assistive device: Rolling  walker Ambulation/Gait Assistance Details: verbal cues for PWB, sequence, using UEs through RW to assist with pain control, verbal cue for no internal rotation and turning towards unaffected LE Gait Pattern: Step-to pattern;Antalgic Gait velocity: decreased    Shoulder Instructions     Exercises     PT Diagnosis: Difficulty walking;Acute pain  PT Problem List: Decreased strength;Decreased activity tolerance;Decreased range of motion;Decreased knowledge of use of DME;Decreased safety awareness;Pain;Decreased mobility;Decreased knowledge of precautions PT Treatment Interventions: DME instruction;Gait training;Stair training;Functional mobility training;Patient/family education;Therapeutic activities;Therapeutic exercise   PT Goals Acute Rehab PT Goals PT Goal Formulation: With patient Time For Goal Achievement: 02/18/12 Potential to Achieve Goals: Good Pt will go Supine/Side to Sit: with supervision PT Goal: Supine/Side to Sit - Progress: Goal set today Pt will go Sit to Supine/Side: with supervision PT Goal: Sit to Supine/Side - Progress: Goal set today Pt will go Sit to Stand: with supervision PT Goal: Sit to Stand - Progress: Goal set today Pt will go Stand to Sit: with supervision PT Goal: Stand to Sit - Progress: Goal set today Pt will Ambulate: 51 - 150 feet;with supervision;with least restrictive assistive device PT Goal: Ambulate - Progress: Goal set today Pt will Perform Home Exercise Program: with supervision, verbal cues required/provided PT Goal: Perform Home Exercise Program - Progress: Goal set today Additional Goals Additional Goal #1: Pt will verbalize and demonstrate PWB and posterior hip precautions without cues. PT Goal: Additional Goal #1 - Progress: Goal set today  Visit Information  Last PT Received On: 02/11/12 Assistance Needed: +2    Subjective Data  Subjective: My son can take care of me.   Prior Functioning  Home Living Lives With: Son Type of  Home: House Home  Access: Level entry Home Layout: One level Home Adaptive Equipment: Straight cane;Walker - rolling Prior Function Level of Independence: Independent with assistive device(s) Communication Communication: No difficulties    Cognition  Overall Cognitive Status: Appears within functional limits for tasks assessed/performed Arousal/Alertness: Awake/alert Orientation Level: Appears intact for tasks assessed Behavior During Session: [facility] for tasks performed    Extremity/Trunk Assessment Right Lower Extremity Assessment RLE ROM/Strength/Tone: Deficits RLE ROM/Strength/Tone Deficits: decreased movement against gravity, LE resting in internal rotation upon entering room Left Lower Extremity Assessment LLE ROM/Strength/Tone: [facility] for tasks assessed   Balance    End of Session PT - End of Session Activity Tolerance: Patient limited by fatigue;Patient limited by pain Patient left: in chair;with call bell/phone within reach  GP     [facility] E 02/11/2012, 11:56 AM Pager: [phone]

## 2012-02-11 NOTE — Progress Notes (Signed)
Subjective: Doing Well this morning.She desires to go home instead of SNF.   Objective: Vital signs in last 24 hours: Temp:  [94.3 F (34.6 C)-98.2 F (36.8 C)] 97.3 F (36.3 C) (11/12 0548) Pulse Rate:  [53-75] 75  (11/12 0548) Resp:  [10-20] 14  (11/12 0548) BP: (137-188)/(53-84) 154/82 mmHg (11/12 0548) SpO2:  [96 %-100 %] 96 % (11/12 0548) Weight:  [58.968 kg (130 lb)-59 kg (130 lb 1.1 oz)] 59 kg (130 lb 1.1 oz) (11/11 2305)  Intake/Output from previous day: 11/11 0701 - 11/12 0700 In: 3215 [P.O.:240; I.V.:1875; IV Piggyback:500] Out: 825 [Urine:625; Blood:200] Intake/Output this shift:     Basename 02/11/12 0432  HGB 9.9*    Basename 02/11/12 0432  WBC 8.2  RBC 3.11*  HCT 30.0*  PLT 204    Basename 02/11/12 0432  NA 136  K 4.5  CL 100  CO2 29  BUN 17  CREATININE 1.25*  GLUCOSE 108*  CALCIUM 8.5    Basename 02/10/12 1250  LABPT --  INR 1.01    Dorsiflexion/Plantar flexion intact  Assessment/Plan: Ambulate today. Home Thursday.   Salle Brandle A 02/11/2012, 7:24 AM

## 2012-02-11 NOTE — Progress Notes (Signed)
CSW consulted for SNF placement. PN reviewed and d/c planning discussed with MD. Pt has declined SNF placement and plans to return home following hospital d/c. RNCM will assist with d/c planning. CSW is available to assist with d/c needs if plan changes and pt will consider SNF placement.  Cori Razor LCSW [phone]

## 2012-02-12 ENCOUNTER — Encounter (HOSPITAL_COMMUNITY): Payer: Self-pay | Admitting: Orthopedic Surgery

## 2012-02-12 LAB — URINE CULTURE

## 2012-02-12 LAB — CBC
MCV: 94.3 fL (ref 78.0–100.0)
Platelets: 185 10*3/uL (ref 150–400)
RBC: 2.79 MIL/uL — ABNORMAL LOW (ref 3.87–5.11)
WBC: 10.4 10*3/uL (ref 4.0–10.5)

## 2012-02-12 LAB — BASIC METABOLIC PANEL
CO2: 28 mEq/L (ref 19–32)
Chloride: 97 mEq/L (ref 96–112)
Creatinine, Ser: 1.31 mg/dL — ABNORMAL HIGH (ref 0.50–1.10)
Potassium: 4.1 mEq/L (ref 3.5–5.1)
Sodium: 132 mEq/L — ABNORMAL LOW (ref 135–145)

## 2012-02-12 LAB — HEMOGLOBIN AND HEMATOCRIT, BLOOD
HCT: 26.9 % — ABNORMAL LOW (ref 36.0–46.0)
Hemoglobin: 9.2 g/dL — ABNORMAL LOW (ref 12.0–15.0)

## 2012-02-12 MED ORDER — COUMADIN BOOK
Freq: Once | Status: AC
  Administered 2012-02-12: 1
  Filled 2012-02-12: qty 1

## 2012-02-12 MED ORDER — WARFARIN SODIUM 2.5 MG PO TABS
2.5000 mg | ORAL_TABLET | Freq: Once | ORAL | Status: AC
  Administered 2012-02-13: 2.5 mg via ORAL
  Filled 2012-02-12: qty 1

## 2012-02-12 MED ORDER — WARFARIN VIDEO
Freq: Once | Status: AC
  Administered 2012-02-12: 14:00:00

## 2012-02-12 MED ORDER — WARFARIN - PHARMACIST DOSING INPATIENT: Freq: Every day | Status: DC

## 2012-02-12 MED FILL — Thrombin For Soln 5000 Unit: CUTANEOUS | Qty: 5000 | Status: AC

## 2012-02-12 NOTE — Progress Notes (Signed)
Clinical Social Work Department CLINICAL SOCIAL WORK PLACEMENT NOTE 02/12/2012  Patient:  Yvonne Beck, Yvonne Beck  Account Number:  [id number] Admit date:  02/10/2012  Clinical Social Worker:  Cori Razor, LCSW  Date/time:  02/12/2012 12:48 PM  Clinical Social Work is seeking post-discharge placement for this patient at the following level of care:   SKILLED NURSING   (*CSW will update this form in Epic as items are completed)   02/12/2012  Patient/family provided with [facility] Department of Clinical Social Work's list of facilities offering this level of care within the geographic area requested by the patient (or if unable, by the patient's family).  02/12/2012  Patient/family informed of their freedom to choose among providers that offer the needed level of care, that participate in Medicare, Medicaid or managed care program needed by the patient, have an available bed and are willing to accept the patient.    Patient/family informed of [facility]' ownership interest in [facility], as well as of the fact that they are under no obligation to receive care at this facility.  PASARR submitted to EDS on 02/12/2012 PASARR number received from EDS on 02/12/2012  FL2 transmitted to all facilities in geographic area requested by pt/family on  02/12/2012 FL2 transmitted to all facilities within larger geographic area on   Patient informed that his/her managed care company has contracts with or will negotiate with  certain facilities, including the following:     Patient/family informed of bed offers received:   Patient chooses bed at  Physician recommends and patient chooses bed at    Patient to be transferred to  on   Patient to be transferred to facility by   The following physician request were entered in Epic:   Additional Comments:  Cori Razor LCSW [phone]

## 2012-02-12 NOTE — Progress Notes (Signed)
Physical Therapy Treatment Note   02/12/12 1500  PT Visit Information  Last PT Received On 02/12/12  Assistance Needed +1  PT Time Calculation  PT Start Time 1448  PT Stop Time 1504  PT Time Calculation (min) 16 min  Subjective Data  Subjective I got in a nap.  Precautions  Precautions Posterior Hip;Fall  Restrictions  Weight Bearing Restrictions Yes  RLE Weight Bearing PWB  RLE Partial Weight Bearing Percentage or Pounds 50%  Cognition  Overall Cognitive Status Appears within functional limits for tasks assessed/performed  Bed Mobility  Bed Mobility Supine to Sit;Sit to Supine  Supine to Sit HOB elevated;4: Min assist  Sit to Supine 4: Min assist;HOB elevated  Details for Bed Mobility Assistance assist for R LE, verbal cues for technique and maintaining precautions  Transfers  Transfers Sit to Stand;Stand to Sit  Sit to Stand 4: Min assist;With upper extremity assist;From bed  Stand to Sit With upper extremity assist;4: Min guard;To bed  Details for Transfer Assistance assist to rise and steady, verbal cues for safe technique within precautions  Ambulation/Gait  Ambulation/Gait Assistance 4: Min guard  Ambulation Distance (Feet) 50 Feet  Assistive device Rolling walker  Ambulation/Gait Assistance Details verbal cues for sequence, step length, RW distance, hip precautions  Gait Pattern Step-to pattern;Antalgic  Gait velocity decreased  PT - End of Session  Activity Tolerance Patient limited by pain;Patient limited by fatigue  Patient left in bed;with call bell/phone within reach  PT - Assessment/Plan  Comments on Treatment Session Pt ambulated again in hallway. Still requires cues for hip precautions  PT Plan Discharge plan needs to be updated;Frequency remains appropriate  Follow Up Recommendations SNF;Supervision/Assistance - 24 hour  Equipment Recommended 3 in 1 bedside comode;None recommended by PT  Acute Rehab PT Goals  PT Goal: Supine/Side to Sit - Progress  Progressing toward goal  PT Goal: Sit to Supine/Side - Progress Progressing toward goal  PT Goal: Sit to Stand - Progress Progressing toward goal  PT Goal: Stand to Sit - Progress Progressing toward goal  PT Goal: Ambulate - Progress Progressing toward goal  Additional Goals  PT Goal: Additional Goal #1 - Progress Progressing toward goal  PT General Charges  $$ ACUTE PT VISIT 1 Procedure  PT Treatments  $Gait Training 8-22 mins    Zenovia Jarred, PT Pager: [phone]

## 2012-02-12 NOTE — Progress Notes (Signed)
CARE MANAGEMENT NOTE 02/12/2012  Patient:  Yvonne Beck, Yvonne Beck   Account Number:  [id number]  Date Initiated:  02/11/2012  Documentation initiated by:  Colleen Can  Subjective/Objective Assessment:   dx total hip replacemnt     Action/Plan:   CM spoke with patient. Plans are incomplete at this time but she wishes to  have her son speak with me. Cm attempted to call son wrong number. She requested that i need number to have son call me   Anticipated DC Date:  02/13/2012   Anticipated DC Plan:  SNF rehab  In-house referral  Clinical Social Worker      DC Planning Services  CM consult      Choice offered to / List presented to:             Status of service:  In process, will continue to follow Medicare Important Message given?   (If response is "NO", the following Medicare IM given date fields will be blank)     Comments:  02/12/2012 Colleen Can BSN RN CCM [phone] Cm spoke with pt and son who is pt's health care power of attorney. The current plan is for SNF rehab. States he has talked with Clinical Child psychotherapist. CM will follow case as needed.

## 2012-02-12 NOTE — Progress Notes (Signed)
ANTICOAGULATION CONSULT NOTE - Initial Consult  Pharmacy Consult for Coumadin Indication: VTE prophylaxis  No Known Allergies  Patient Measurements: Height: 5\' 4"  (162.6 cm) Weight: 130 lb 1.1 oz (59 kg) IBW/kg (Calculated) : 54.7   Vital Signs: Temp: 98.6 F (37 C) (11/13 0532) BP: 132/82 mmHg (11/13 0913) Pulse Rate: 84  (11/13 0532)  Labs:  Basename 02/12/12 0439 02/11/12 0432 02/10/12 1250  HGB 8.9* 9.9* --  HCT 26.3* 30.0* --  PLT 185 204 --  APTT -- -- 26  LABPROT -- -- 13.2  INR -- -- 1.01  HEPARINUNFRC -- -- --  CREATININE 1.31* 1.25* --  CKTOTAL -- -- --  CKMB -- -- --  TROPONINI -- -- --    Estimated Creatinine Clearance: 26.6 ml/min (by C-G formula based on Cr of 1.31).   Medical History: Past Medical History  Diagnosis Date  . Hypertension   . Arthritis     Medications:  Prescriptions prior to admission  Medication Sig Dispense Refill  . aspirin EC 81 MG tablet Take 81 mg by mouth daily.      . fluticasone (FLONASE) 50 MCG/ACT nasal spray Place 2 sprays into the nose daily.      Marland Kitchen latanoprost (XALATAN) 0.005 % ophthalmic solution Place 1 drop into the right eye every morning.      Marland Kitchen lisinopril (PRINIVIL,ZESTRIL) 40 MG tablet Take 40 mg by mouth daily.      . timolol (TIMOPTIC-XR) 0.25 % ophthalmic gel-forming Place 1 drop into the right eye every morning.       Scheduled:    . ferrous sulfate  325 mg Oral TID PC  . fluticasone  2 spray Each Nare Daily  . latanoprost  1 drop Right Eye q morning - 10a  . lisinopril  40 mg Oral Daily  . timolol  1 drop Right Eye q morning - 10a  . [DISCONTINUED] rivaroxaban  10 mg Oral Q breakfast   Infusions:    . lactated ringers 100 mL/hr at 02/12/12 0735   PRN: acetaminophen, acetaminophen, alum & mag hydroxide-simeth, bisacodyl, HYDROcodone-acetaminophen, HYDROmorphone (DILAUDID) injection, menthol-cetylpyridinium, methocarbamol (ROBAXIN) IV, methocarbamol, ondansetron (ZOFRAN) IV, ondansetron,  oxyCODONE-acetaminophen, phenol, polyethylene glycol  Assessment:  7 YOF s/p R THA  Was ordered Xarelto Post op for VTE ppx. Has rec'd two doses (~0740 11/12 and 11/13)  Scr increased, CrCl <30, Changing to Coumadin per pharmacy since Xarelto contraindicated in pt w/CrCL <30.   No bleeding reported. Hgb down - post op blood loss anemia per PA-C notes  Per Xarelto package insert, when converting from oral Xarelto to oral anticoagulant, new anticoagulant should be given ~ time next Xarelto dose was due. Given that pt's CrCl is <30, will give slightly later than this recommended time with plans to transition to evening Coumadin administration times 11/15  Goal of Therapy:  INR 2-3    Plan:   Daily INR  Coumadin education book and video, counseling before d/c  Coumadin 2.5mg  tomorrow ~ 1000 am - will plan transition to PM dosing 11/15  Gwen Her PharmD  [phone] 02/12/2012 9:53 AM

## 2012-02-12 NOTE — Progress Notes (Signed)
   Subjective: 2 Days Post-Op Procedure(s) (LRB): TOTAL HIP ARTHROPLASTY (Right) Patient reports pain as mild.   Patient seen in rounds without Dr. Darrelyn Hillock. Patient is well, and has had no acute complaints or problems. She denies shortness of breath and chest pain. She denies abdominal pain. She has had some difficulty urinating, requiring a couple in and out caths. She is hopeful to go home soon.    Objective: Vital signs in last 24 hours: Temp:  [97.6 F (36.4 C)-98.7 F (37.1 C)] 98.6 F (37 C) (11/13 0532) Pulse Rate:  [81-92] 84  (11/13 0532) Resp:  [14-16] 14  (11/13 0532) BP: (113-145)/(62-78) 145/78 mmHg (11/13 0532) SpO2:  [94 %-100 %] 99 % (11/13 0532)  Intake/Output from previous day:  Intake/Output Summary (Last 24 hours) at 02/12/12 0759 Last data filed at 02/12/12 0648  Gross per 24 hour  Intake   3480 ml  Output   1975 ml  Net   1505 ml     Labs:  Basename 02/12/12 0439 02/11/12 0432  HGB 8.9* 9.9*    Basename 02/12/12 0439 02/11/12 0432  WBC 10.4 8.2  RBC 2.79* 3.11*  HCT 26.3* 30.0*  PLT 185 204    Basename 02/12/12 0439 02/11/12 0432  NA 132* 136  K 4.1 4.5  CL 97 100  CO2 28 29  BUN 14 17  CREATININE 1.31* 1.25*  GLUCOSE 99 108*  CALCIUM 8.5 8.5    Basename 02/10/12 1250  LABPT --  INR 1.01    EXAM General - Patient is Alert and Oriented Extremity - Neurologically intact Dorsiflexion/Plantar flexion intact No cellulitis present Dressing/Incision - clean, dry, no drainage Motor Function - intact, moving foot and toes well on exam.   Past Medical History  Diagnosis Date  . Hypertension   . Arthritis     Assessment/Plan: 2 Days Post-Op Procedure(s) (LRB): TOTAL HIP ARTHROPLASTY (Right) Active Problems:  Avascular necrosis of femoral head  Postoperative anemia due to acute blood loss  Estimated Body mass index is 22.33 kg/(m^2) as calculated from the following:   Height as of this encounter: 5\' 4" (1.626 m).   Weight as  of this encounter: 130 lb 1.1 oz(59 kg). Advance diet Up with therapy Plan for discharge tomorrow with home health  We will continue to watch her in regards to urination. She had an in and out cath at 645 this morning yielding 800cc. I want to see how she does once she is up with therapy. She was encouraged by myself and the nurse to drink plenty of fluids. She is still on IV fluids as well. Will recheck H&H this afternoon as she continues to trail down.   DVT Prophylaxis - Xarelto 50% weight bearing   Derin Matthes LAUREN 02/12/2012, 7:59 AM

## 2012-02-12 NOTE — Progress Notes (Signed)
Physical Therapy Treatment Patient Details Name: Yvonne Beck MRN: [id number] DOB: March 31, 1924 Today's Date: 02/12/2012 Time: 7829-5621 PT Time Calculation (min): 24 min  PT Assessment / Plan / Recommendation Comments on Treatment Session  Pt unable to recall precautions so reeducated and demonstrated prior to mobility.  Pt ambulated in hallway and performed exercises.  Pt requires cues throughout mobility to maintain posterior hip precautions.    Follow Up Recommendations  SNF;Supervision/Assistance - 24 hour     Does the patient have the potential to tolerate intense rehabilitation     Barriers to Discharge        Equipment Recommendations  3 in 1 bedside comode;None recommended by PT    Recommendations for Other Services    Frequency     Plan Discharge plan needs to be updated;Frequency remains appropriate    Precautions / Restrictions Precautions Precautions: Posterior Hip;Fall Restrictions Weight Bearing Restrictions: Yes RLE Weight Bearing: Partial weight bearing RLE Partial Weight Bearing Percentage or Pounds: 50%   Pertinent Vitals/Pain 8/10 R hip pain after exercises, pt reports premedicated, ice applied    Mobility  Bed Mobility Bed Mobility: Supine to Sit Supine to Sit: HOB elevated;4: Min assist Details for Bed Mobility Assistance: assist for R LE, verbal cues for technique and maintaining precautions Transfers Transfers: Sit to Stand;Stand to Sit Sit to Stand: 4: Min assist;With upper extremity assist;From bed Stand to Sit: 4: Min assist;With upper extremity assist;To chair/3-in-1 Details for Transfer Assistance: assist for rise and control descent, verbal cues for safe technique Ambulation/Gait Ambulation/Gait Assistance: 4: Min guard Ambulation Distance (Feet): 80 Feet Assistive device: Rolling walker Ambulation/Gait Assistance Details: verbal cues for hip precautions, PWB, sequence, RW distance, step length Gait Pattern: Step-to pattern;Antalgic Gait  velocity: decreased    Exercises Total Joint Exercises Ankle Circles/Pumps: AROM;Both;15 reps Quad Sets: AROM;Both;15 reps Gluteal Sets: AROM;Both;15 reps Towel Squeeze: AROM;Both;15 reps Short Arc Quad: AROM;Right;15 reps Heel Slides: AROM;Right;15 reps (within precautions) Hip ABduction/ADduction: AROM;Right;15 reps   PT Diagnosis:    PT Problem List:   PT Treatment Interventions:     PT Goals Acute Rehab PT Goals PT Goal: Supine/Side to Sit - Progress: Progressing toward goal PT Goal: Sit to Stand - Progress: Progressing toward goal PT Goal: Stand to Sit - Progress: Progressing toward goal PT Goal: Ambulate - Progress: Progressing toward goal PT Goal: Perform Home Exercise Program - Progress: Progressing toward goal Additional Goals PT Goal: Additional Goal #1 - Progress: Progressing toward goal  Visit Information  Last PT Received On: 02/12/12 Assistance Needed: +1    Subjective Data  Subjective: I don't remember those.  (precautions)   Cognition  Overall Cognitive Status: Appears within functional limits for tasks assessed/performed Arousal/Alertness: Awake/alert Orientation Level: Appears intact for tasks assessed Behavior During Session: [facility] for tasks performed    Balance     End of Session PT - End of Session Activity Tolerance: Patient tolerated treatment well Patient left: in chair;with call bell/phone within reach Nurse Communication: Other (comment) (nsg tech aware pt up in chair)   GP     Yvonne Beck,KATHrine E 02/12/2012, 12:28 PM Pager: [phone]

## 2012-02-12 NOTE — Progress Notes (Signed)
02/12/12 0100 Nursing Pt only voided 75cc in bsc . Bladder scanned for over 600cc urine. I/O cath at this time for 800cc. Patient tolerated procedure well. Will continue to monitor.

## 2012-02-12 NOTE — Evaluation (Signed)
Occupational Therapy Evaluation Patient Details Name: Yvonne Beck MRN: [id number] DOB: 02/07/1924 Today's Date: 02/12/2012 Time: 4098-1191 OT Time Calculation (min): 24 min  OT Assessment / Plan / Recommendation Clinical Impression  This 78 year old female was admitted for R posterior approach THA.  She is appropriate for skilled OT to increase safety and independence with adls, following THPs and PWB.  Goals in acute are for min guard to min A    OT Assessment  Patient needs continued OT Services    Follow Up Recommendations  SNF    Barriers to Discharge      Equipment Recommendations  3 in 1 bedside comode;None recommended by PT    Recommendations for Other Services    Frequency  Min 2X/week    Precautions / Restrictions Precautions Precautions: Posterior Hip;Fall Restrictions Weight Bearing Restrictions: Yes RLE Weight Bearing: Partial weight bearing RLE Partial Weight Bearing Percentage or Pounds: 50%   Pertinent Vitals/Pain No pain while sitting in recliner; with feet down and supported, also no pain reported    ADL  Grooming: Simulated;Set up Where Assessed - Grooming: Supported sitting Upper Body Bathing: Simulated;Supervision/safety Where Assessed - Upper Body Bathing: Supported sitting Lower Body Bathing: Simulated;Minimal assistance (with ae) Where Assessed - Lower Body Bathing: Supported sit to stand Upper Body Dressing: Simulated;Supervision/safety Where Assessed - Upper Body Dressing: Supported sitting Lower Body Dressing: Simulated;Maximal assistance (with ae:  needs reinforcment) Where Assessed - Lower Body Dressing: Supported sit to stand Equipment Used: Reacher;Sock aid Transfers/Ambulation Related to ADLs: Pt did not want to get up from chair and had no toileting needs during this session. ADL Comments: Educated on AE and THPs.  Pt will need reinforcement.  She has reachers at home but has never used them for adls.  Practiced.  Pt will need  reinforcement with both.  Son came at end of session:  he confirms that they would like stsnf.  RN to call SW.    OT Diagnosis: Generalized weakness  OT Problem List: Decreased strength;Decreased activity tolerance;Decreased knowledge of use of DME or AE;Decreased knowledge of precautions;Pain OT Treatment Interventions: Self-care/ADL training;Patient/family education;DME and/or AE instruction;Therapeutic activities   OT Goals Acute Rehab OT Goals OT Goal Formulation: With patient/family Time For Goal Achievement: 02/19/12 Potential to Achieve Goals: Good ADL Goals Pt Will Perform Grooming: with supervision;Standing at sink;Supported ADL Goal: Grooming - Progress: Goal set today Pt Will Perform Lower Body Bathing: with min assist;Sit to stand from chair;Supported;with adaptive equipment;with cueing (comment type and amount) (min guard, min cues) ADL Goal: Lower Body Bathing - Progress: Goal set today Pt Will Perform Lower Body Dressing: with min assist;Sit to stand from chair;Supported;with adaptive equipment;with cueing (comment type and amount) (min cues) ADL Goal: Lower Body Dressing - Progress: Goal set today Pt Will Transfer to Toilet: with min assist;Ambulation;3-in-1;Maintaining hip precautions;Maintaining weight bearing status;with cueing (comment type and amount) (min guard, min vcs) ADL Goal: Toilet Transfer - Progress: Goal set today Pt Will Perform Toileting - Hygiene: with supervision;Sit to stand from 3-in-1/toilet;with cueing (comment type and amount) (min cues) ADL Goal: Toileting - Hygiene - Progress: Goal set today Miscellaneous OT Goals Miscellaneous OT Goal #1: Pt will verbalize 3/3 thps and pwb precaution OT Goal: Miscellaneous Goal #1 - Progress: Goal set today  Visit Information  Last OT Received On: 02/12/12    Subjective Data  Subjective: I have 2 reachers at home.  I've decided I'll go to rehab first Patient Stated Goal: get back to being independent  Prior  Functioning     Prior Function Level of Independence: Independent with assistive device(s) Communication Communication: No difficulties Dominant Hand: Right         Vision/Perception     Cognition  Overall Cognitive Status: Appears within functional limits for tasks assessed/performed Arousal/Alertness: Awake/alert Orientation Level: Appears intact for tasks assessed Behavior During Session: [facility] for tasks performed    Extremity/Trunk Assessment Right Upper Extremity Assessment RUE ROM/Strength/Tone: Within functional levels Left Upper Extremity Assessment LUE ROM/Strength/Tone: Within functional levels     Mobility       Shoulder Instructions     Exercise     Balance     End of Session OT - End of Session Activity Tolerance: Patient tolerated treatment well Patient left: in bed;with call bell/phone within reach;with family/visitor present  GO     [name] 02/12/2012, 11:16 AM Marica Otter, OTR/L [phone] 02/12/2012

## 2012-02-12 NOTE — Progress Notes (Signed)
Clinical Social Work Department BRIEF PSYCHOSOCIAL ASSESSMENT 02/12/2012  Patient:  Yvonne Beck, Yvonne Beck     Account Number:  [id number]     Admit date:  02/10/2012  Clinical Social Worker:  Candie Chroman  Date/Time:  02/12/2012 12:11 PM  Referred by:  RN  Date Referred:  02/12/2012 Referred for  SNF Placement   Other Referral:   Interview type:  Patient Other interview type:    PSYCHOSOCIAL DATA Living Status:  WITH ADULT CHILDREN Admitted from facility:   Level of care:   Primary support name:  Kirke Shaggy Primary support relationship to patient:  CHILD, ADULT Degree of support available:   supportive    CURRENT CONCERNS Current Concerns  Post-Acute Placement   Other Concerns:    SOCIAL WORK ASSESSMENT / PLAN Pt is an 78 yr old female living at home prior to hospitalization. CSW met with pt/son to assist with d/c planning. Pt / son requesting ST Rehab following hospital d/c. SNF search has been initiated and bed offers will be provided as received. Pt has [facility] which requires prior approval . Once SNF has been chosen CSW will assist with authorization process.   Assessment/plan status:  Psychosocial Support/Ongoing Assessment of Needs Other assessment/ plan:   Information/referral to community resources:   SNF list provided. Insurance coverage reviewed.    PATIENT'S/FAMILY'S RESPONSE TO PLAN OF CARE: Pt / family feel ST SNF placement would be helpful prior to returning home.   Cori Razor LCSW [phone]

## 2012-02-13 LAB — CBC
Hemoglobin: 8.2 g/dL — ABNORMAL LOW (ref 12.0–15.0)
Platelets: 166 10*3/uL (ref 150–400)
RBC: 2.53 MIL/uL — ABNORMAL LOW (ref 3.87–5.11)
WBC: 9.9 10*3/uL (ref 4.0–10.5)

## 2012-02-13 LAB — PROTIME-INR
INR: 1.56 — ABNORMAL HIGH (ref 0.00–1.49)
Prothrombin Time: 18.2 seconds — ABNORMAL HIGH (ref 11.6–15.2)

## 2012-02-13 LAB — HEMOGLOBIN AND HEMATOCRIT, BLOOD
HCT: 25.1 % — ABNORMAL LOW (ref 36.0–46.0)
Hemoglobin: 8.8 g/dL — ABNORMAL LOW (ref 12.0–15.0)

## 2012-02-13 MED ORDER — BLISTEX EX OINT
TOPICAL_OINTMENT | CUTANEOUS | Status: AC
  Administered 2012-02-13: 16:00:00
  Filled 2012-02-13: qty 10

## 2012-02-13 MED ORDER — BLISTEX EX OINT
TOPICAL_OINTMENT | CUTANEOUS | Status: AC
  Administered 2012-02-13: 1
  Filled 2012-02-13: qty 10

## 2012-02-13 NOTE — Progress Notes (Signed)
ANTICOAGULATION CONSULT NOTE - Follow Up Consult  Pharmacy Consult for Coumadin Indication: VTE prophylaxis  No Known Allergies  Patient Measurements: Height: 5\' 4"  (162.6 cm) Weight: 130 lb 1.1 oz (59 kg) IBW/kg (Calculated) : 54.7   Vital Signs: Temp: 98.3 F (36.8 C) (11/14 0933) Temp src: Oral (11/14 0933) BP: 125/76 mmHg (11/14 0933) Pulse Rate: 76  (11/14 0933)  Labs:  Basename 02/13/12 0439 02/12/12 1501 02/12/12 0439 02/11/12 0432 02/10/12 1250  HGB 8.2* 9.2* -- -- --  HCT 23.6* 26.9* 26.3* -- --  PLT 166 -- 185 204 --  APTT -- -- -- -- 26  LABPROT 18.2* -- -- -- 13.2  INR 1.56* -- -- -- 1.01  HEPARINUNFRC -- -- -- -- --  CREATININE -- -- 1.31* 1.25* --  CKTOTAL -- -- -- -- --  CKMB -- -- -- -- --  TROPONINI -- -- -- -- --    Estimated Creatinine Clearance: 26.6 ml/min (by C-G formula based on Cr of 1.31).   Medications:  Scheduled:    . [COMPLETED] coumadin book   Does not apply Once  . ferrous sulfate  325 mg Oral TID PC  . fluticasone  2 spray Each Nare Daily  . latanoprost  1 drop Right Eye q morning - 10a  . lisinopril  40 mg Oral Daily  . timolol  1 drop Right Eye q morning - 10a  . [COMPLETED] warfarin  2.5 mg Oral Once  . [COMPLETED] warfarin   Does not apply Once  . Warfarin - Pharmacist Dosing Inpatient   Does not apply q1800    Assessment:  53 yoF s/p R THA; received 2 doses Xarelto for VTE prophylaxis; changed to Warfarin. Xarelto contraindicated for Clearance < 30 ml/min.  Warfarin to begin today with 2.5mg  this am; scheduled when next Xarelto was due. Plan to transition to q1800 Warfarin dosing tomorrow  INR 1.56 this am before Warfarin  Goal of Therapy:  INR 2-3   Plan:  Warfarin 2.5mg  this am Daily PT/INR Coumadin teaching booklet/video Coumadin counseling before discharge.  Otho Bellows PharmD Pager [phone] 02/13/2012,10:54 AM

## 2012-02-13 NOTE — Progress Notes (Signed)
Physical Therapy Treatment Patient Details Name: JOHNELLE TATSCH MRN: [id number] DOB: 05/08/1923 Today's Date: 02/13/2012 Time: 1040-1105 PT Time Calculation (min): 25 min  PT Assessment / Plan / Recommendation Comments on Treatment Session  Pt able to tolerate increase in ambulation distance and then performed exercises.  Pt plans to d/c to SNF.    Follow Up Recommendations  SNF;Supervision/Assistance - 24 hour     Does the patient have the potential to tolerate intense rehabilitation     Barriers to Discharge        Equipment Recommendations  3 in 1 bedside comode;None recommended by PT    Recommendations for Other Services    Frequency     Plan Discharge plan needs to be updated;Frequency remains appropriate    Precautions / Restrictions Precautions Precautions: Posterior Hip;Fall Precaution Booklet Issued: Yes (comment) Precaution Comments: reviewed all hip precautions verbally and during mobility Restrictions Weight Bearing Restrictions: Yes RLE Weight Bearing: Partial weight bearing RLE Partial Weight Bearing Percentage or Pounds: 50%   Pertinent Vitals/Pain 7/10 R hip pain, student RN in room and aware of pain: to check on pain meds.    Mobility  Transfers Transfers: Sit to Stand;Stand to Sit Sit to Stand: 4: Min assist;With upper extremity assist;From chair/3-in-1 Stand to Sit: With upper extremity assist;To chair/3-in-1;4: Min guard Details for Transfer Assistance: assist to rise and steady, verbal cues for safe technique within precautions Ambulation/Gait Ambulation/Gait Assistance: 4: Min guard Ambulation Distance (Feet): 120 Feet Assistive device: Rolling walker Ambulation/Gait Assistance Details: verbal cues for step length, turning towards unaffected LE, decreasing internal rotation Gait Pattern: Step-to pattern;Antalgic Gait velocity: decreased    Exercises Total Joint Exercises Ankle Circles/Pumps: AROM;Both;15 reps Quad Sets: AROM;Both;15  reps Gluteal Sets: AROM;Both;15 reps Towel Squeeze: AROM;Both;15 reps Short Arc Quad: AROM;Right;15 reps Heel Slides: AROM;Right;15 reps Hip ABduction/ADduction: AROM;Right;15 reps Straight Leg Raises: AAROM;Right;15 reps Long Arc Quad: AROM;Right;15 reps   PT Diagnosis:    PT Problem List:   PT Treatment Interventions:     PT Goals Acute Rehab PT Goals PT Goal: Sit to Stand - Progress: Progressing toward goal PT Goal: Stand to Sit - Progress: Progressing toward goal PT Goal: Ambulate - Progress: Progressing toward goal PT Goal: Perform Home Exercise Program - Progress: Progressing toward goal  Visit Information  Last PT Received On: 02/13/12 Assistance Needed: +1    Subjective Data  Subjective: I'm going to rehab because it's not safe for me to go home.   Cognition  Overall Cognitive Status: Appears within functional limits for tasks assessed/performed Arousal/Alertness: Awake/alert Orientation Level: Appears intact for tasks assessed Behavior During Session: [facility] for tasks performed    Balance     End of Session PT - End of Session Activity Tolerance: Patient tolerated treatment well Patient left: in chair;with call bell/phone within reach   GP     Reymundo Winship,KATHrine E 02/13/2012, 1:11 PM Pager: [phone]

## 2012-02-13 NOTE — Plan of Care (Signed)
Problem: Discharge Progression Outcomes Goal: Barriers To Progression Addressed/Resolved Foley # 2 dcd awaiting void/ dc to snf in am

## 2012-02-13 NOTE — Progress Notes (Signed)
   Subjective: 3 Days Post-Op Procedure(s) (LRB): TOTAL HIP ARTHROPLASTY (Right) Patient reports pain as mild.   Patient seen in rounds with Dr. Lequita Halt. Patient is well, and has had no acute complaints or problems. She reports that she slept well last night. No issues overnight. Continues to have foley catheter in. She reports mild pain in the hip but somewhat better than yesterday. She denies shortness of breath and chest pain, as well as dizziness. Plan is to go Skilled nursing facility after hospital stay.  Objective: Vital signs in last 24 hours: Temp:  [98.2 F (36.8 C)-100.1 F (37.8 C)] 98.9 F (37.2 C) (11/14 0645) Pulse Rate:  [62-77] 77  (11/14 0625) Resp:  [14-15] 14  (11/14 0625) BP: (90-133)/(57-82) 126/75 mmHg (11/14 0625) SpO2:  [96 %-97 %] 97 % (11/14 0625)  Intake/Output from previous day:  Intake/Output Summary (Last 24 hours) at 02/13/12 0730 Last data filed at 02/13/12 0625  Gross per 24 hour  Intake    720 ml  Output   1810 ml  Net  -1090 ml     Labs:  Basename 02/13/12 0439 02/12/12 1501 02/12/12 0439 02/11/12 0432  HGB 8.2* 9.2* 8.9* 9.9*    Basename 02/13/12 0439 02/12/12 1501 02/12/12 0439  WBC 9.9 -- 10.4  RBC 2.53* -- 2.79*  HCT 23.6* 26.9* --  PLT 166 -- 185    Basename 02/12/12 0439 02/11/12 0432  NA 132* 136  K 4.1 4.5  CL 97 100  CO2 28 29  BUN 14 17  CREATININE 1.31* 1.25*  GLUCOSE 99 108*  CALCIUM 8.5 8.5    Basename 02/13/12 0439 02/10/12 1250  LABPT -- --  INR 1.56* 1.01    EXAM General - Patient is Alert and Oriented Extremity - Neurologically intact Neurovascular intact Dorsiflexion/Plantar flexion intact Compartment soft Dressing/Incision - clean, dry, no drainage Motor Function - intact, moving foot and toes well on exam.   Past Medical History  Diagnosis Date  . Hypertension   . Arthritis     Assessment/Plan: 3 Days Post-Op Procedure(s) (LRB): TOTAL HIP ARTHROPLASTY (Right) Active Problems:  Avascular necrosis of femoral head  Postoperative anemia due to acute blood loss  Estimated Body mass index is 22.33 kg/(m^2) as calculated from the following:   Height as of this encounter: 5\' 4" (1.626 m).   Weight as of this encounter: 130 lb 1.1 oz(59 kg). Advance diet Up with therapy Continue foley due to acute urinary retention  DVT Prophylaxis - Coumadin 50% PWB  Will recheck Hgb this afternoon. Will discuss further approach to urinary retention with Dr. Darrelyn Hillock.  Plan on discharge to SNF tomorrow unless significant progress early today. Have therapy reinforce with her about hip precautions.    Gean Laursen LAUREN 02/13/2012, 7:30 AM

## 2012-02-13 NOTE — Progress Notes (Signed)
Occupational Therapy Treatment Patient Details Name: Yvonne Beck MRN: [id number] DOB: [birth year] Today's Date: 02/13/2012 Time: 4098-1191 OT Time Calculation (min): 20 min  OT Assessment / Plan / Recommendation Comments on Treatment Session Practiced further with AE and reinforced THPs. Pt having difficulty recalling all precautions despite multiple times reviewing precautions during session. Pt will need more hands on practice with AE also.     Follow Up Recommendations  SNF    Barriers to Discharge       Equipment Recommendations  3 in 1 bedside comode;None recommended by PT    Recommendations for Other Services    Frequency Min 2X/week   Plan Discharge plan remains appropriate    Precautions / Restrictions Precautions Precautions: Posterior Hip;Fall Precaution Booklet Issued: Yes (comment) Precaution Comments: reviewed THPs with pt.  she has difficulty recalling precautions. Restrictions Weight Bearing Restrictions: Yes RLE Weight Bearing: Partial weight bearing RLE Partial Weight Bearing Percentage or Pounds: 50        ADL  Lower Body Dressing: Simulated;Moderate assistance;Other (comment) (with AE) Where Assessed - Lower Body Dressing: Unsupported sitting Equipment Used: Long-handled shoe horn;Long-handled sponge;Reacher;Sock aid ADL Comments: Pt having difficulty recalling THPs. She couldnt state any precautions initially. After reviewing them and then practicing with AE, she could name 1/3 precautions. Reviewed precautions further. Also reinforced WB status with her. She will need more reinforcement and practice with AE and review of precautions. Pt used reacher X2 to doff sock. Min cues for technique. Mod assist with sock aid to don sock. Demonstrated and simulated with LHS and long shoe horn.     OT Diagnosis:    OT Problem List:   OT Treatment Interventions:     OT Goals ADL Goals ADL Goal: Lower Body Dressing - Progress: Progressing toward  goals Miscellaneous OT Goals OT Goal: Miscellaneous Goal #1 - Progress: Progressing toward goals  Visit Information  Last OT Received On: 02/13/12 Assistance Needed: +1    Subjective Data  Subjective: I have two of everything. two canes, two reachers Patient Stated Goal: to be independent   Prior Functioning       Cognition  Overall Cognitive Status: Appears within functional limits for tasks assessed/performed Arousal/Alertness: Awake/alert Orientation Level: Appears intact for tasks assessed Behavior During Session: [facility] for tasks performed    Mobility  Shoulder Instructions         Exercises      Balance     End of Session OT - End of Session Activity Tolerance: Patient tolerated treatment well Patient left: in chair;with call bell/phone within reach  GO     Lennox Laity [phone] 02/13/2012, 12:19 PM

## 2012-02-14 LAB — PROTIME-INR: Prothrombin Time: 14.6 seconds (ref 11.6–15.2)

## 2012-02-14 MED ORDER — WARFARIN SODIUM 2.5 MG PO TABS: 2.5000 mg | ORAL_TABLET | Freq: Every day | ORAL | Status: DC

## 2012-02-14 MED ORDER — HYDROCODONE-ACETAMINOPHEN 5-325 MG PO TABS: 1.0000 | ORAL_TABLET | ORAL | Status: DC | PRN

## 2012-02-14 MED ORDER — BISACODYL 10 MG RE SUPP: 10.0000 mg | Freq: Every day | RECTAL | Status: DC | PRN

## 2012-02-14 MED ORDER — METHOCARBAMOL 500 MG PO TABS: 500.0000 mg | ORAL_TABLET | Freq: Four times a day (QID) | ORAL | Status: DC | PRN

## 2012-02-14 MED ORDER — FERROUS SULFATE 325 (65 FE) MG PO TABS: 325.0000 mg | ORAL_TABLET | Freq: Three times a day (TID) | ORAL | Status: AC

## 2012-02-14 NOTE — Care Management Note (Signed)
    Page 1 of 2   02/14/2012     3:50:38 PM   CARE MANAGEMENT NOTE 02/14/2012  Patient:  Yvonne Beck, Yvonne Beck   Account Number:  [id number]  Date Initiated:  02/11/2012  Documentation initiated by:  Colleen Can  Subjective/Objective Assessment:   dx total hip replacemnt     Action/Plan:   CM spoke with patient. Plans are incomplete at this time but she wishes to  have her son speak with me. Cm attempted to call son wrong number. She requested that i need number to have son call me   Anticipated DC Date:  02/13/2012   Anticipated DC Plan:  SKILLED NURSING FACILITY  In-house referral  Clinical Social Worker      DC Planning Services  CM consult      [facility] Choice  NA   Choice offered to / List presented to:  NA   DME arranged  NA      DME agency  NA     HH arranged  NA      HH agency  NA   Status of service:  Completed, signed off Medicare Important Message given?   (If response is "NO", the following Medicare IM given date fields will be blank) Date Medicare IM given:   Date Additional Medicare IM given:    Discharge Disposition:  SKILLED NURSING FACILITY  Per UR Regulation:  Reviewed for med. necessity/level of care/duration of stay  If discussed at Long Length of Stay Meetings, dates discussed:    Comments:  02/12/2012 Colleen Can BSN RN CCM [phone] Cm spoke with pt and son who is pt's health care power of attorney. The current plan is for SNF rehab. States he has talked with Clinical Child psychotherapist. CM will follow case as needed.

## 2012-02-14 NOTE — Progress Notes (Signed)
Clinical Social Work Department CLINICAL SOCIAL WORK PLACEMENT NOTE 02/14/2012  Patient:  Yvonne Beck, Yvonne Beck  Account Number:  [id number] Admit date:  02/10/2012  Clinical Social Worker:  Cori Razor, LCSW  Date/time:  02/12/2012 12:48 PM  Clinical Social Work is seeking post-discharge placement for this patient at the following level of care:   SKILLED NURSING   (*CSW will update this form in Epic as items are completed)   02/12/2012  Patient/family provided with [facility] Department of Clinical Social Work's list of facilities offering this level of care within the geographic area requested by the patient (or if unable, by the patient's family).  02/12/2012  Patient/family informed of their freedom to choose among providers that offer the needed level of care, that participate in Medicare, Medicaid or managed care program needed by the patient, have an available bed and are willing to accept the patient.    Patient/family informed of [facility]' ownership interest in [facility], as well as of the fact that they are under no obligation to receive care at this facility.  PASARR submitted to EDS on 02/12/2012 PASARR number received from EDS on 02/12/2012  FL2 transmitted to all facilities in geographic area requested by pt/family on  02/12/2012 FL2 transmitted to all facilities within larger geographic area on   Patient informed that his/her managed care company has contracts with or will negotiate with  certain facilities, including the following:     Patient/family informed of bed offers received:  02/12/2012 Patient chooses bed at [facility] [place] Physician recommends and patient chooses bed at    Patient to be transferred to [facility] [place] on  02/14/2012 Patient to be transferred to facility by [facility]  The following physician request were entered in Epic:   Additional Comments: [employer] provided prior approval for SNF  placement.  Cori Razor LCSW [phone]

## 2012-02-14 NOTE — Progress Notes (Signed)
Physical Therapy Treatment Patient Details Name: Yvonne Beck MRN: [id number] DOB: Jan 14, 1924 Today's Date: 02/14/2012 Time: 4098-1191 PT Time Calculation (min): 26 min  PT Assessment / Plan / Recommendation Comments on Treatment Session  Pt ambulated in hallway and performed exercises.  Pt to d/c to SNF today.    Follow Up Recommendations  SNF;Supervision/Assistance - 24 hour     Does the patient have the potential to tolerate intense rehabilitation     Barriers to Discharge        Equipment Recommendations  3 in 1 bedside comode;None recommended by PT    Recommendations for Other Services    Frequency     Plan Discharge plan needs to be updated;Frequency remains appropriate    Precautions / Restrictions Precautions Precautions: Posterior Hip;Fall Precaution Comments: reviewed all hip precautions verbally and during mobility Restrictions Weight Bearing Restrictions: Yes RLE Weight Bearing: Partial weight bearing RLE Partial Weight Bearing Percentage or Pounds: 50%   Pertinent Vitals/Pain 6-7/10 R hip, pt reports premedicated    Mobility  Transfers Transfers: Sit to Stand;Stand to Sit Sit to Stand: 4: Min guard;With upper extremity assist;From chair/3-in-1 Stand to Sit: 4: Min guard;With upper extremity assist;To chair/3-in-1 Details for Transfer Assistance: pt on [facility] upon entering, reminded of precautions for hygiene, verbal cues for R LE forward and hand placement Ambulation/Gait Ambulation/Gait Assistance: 4: Min guard Ambulation Distance (Feet): 160 Feet Assistive device: Rolling walker Ambulation/Gait Assistance Details: verbal cue for RW distance, pt able to recall correct turning Gait Pattern: Step-to pattern;Antalgic Gait velocity: decreased    Exercises Total Joint Exercises Ankle Circles/Pumps: AROM;Both;15 reps Quad Sets: AROM;Both;15 reps Gluteal Sets: AROM;Both;15 reps Short Arc Quad: AROM;Right;15 reps Heel Slides: AROM;Right;15 reps Hip  ABduction/ADduction: AROM;Right;15 reps Long Arc Quad: AROM;Right;15 reps   PT Diagnosis:    PT Problem List:   PT Treatment Interventions:     PT Goals Acute Rehab PT Goals PT Goal: Sit to Stand - Progress: Progressing toward goal PT Goal: Stand to Sit - Progress: Progressing toward goal PT Goal: Ambulate - Progress: Progressing toward goal PT Goal: Perform Home Exercise Program - Progress: Progressing toward goal  Visit Information  Last PT Received On: 02/14/12 Assistance Needed: +1    Subjective Data  Subjective: I'm gonna do what I need to.   Cognition  Overall Cognitive Status: Appears within functional limits for tasks assessed/performed    Balance     End of Session PT - End of Session Activity Tolerance: Patient tolerated treatment well Patient left: in chair;with call bell/phone within reach   GP     [facility] E 02/14/2012, 2:06 PM Pager: [phone]

## 2012-02-14 NOTE — Discharge Summary (Signed)
Physician Discharge Summary   Patient ID: Yvonne Beck MRN: [id number] DOB/AGE: [birth year] 78 y.o.  Admit date: 02/10/2012 Discharge date: 02/14/2012  Primary Diagnosis:  Osteoarthritis, right hip AVN, right hip  Admission Diagnoses:  Past Medical History  Diagnosis Date  . Hypertension   . Arthritis    Discharge Diagnoses:   Active Problems:  Avascular necrosis of femoral head  Postoperative anemia due to acute blood loss S/P right total hip arthroplasty  Estimated Body mass index is 22.33 kg/(m^2) as calculated from the following:   Height as of this encounter: 5\' 4" (1.626 m).   Weight as of this encounter: 130 lb 1.1 oz(59 kg).  Classification of overweight in adults according to BMI (WHO, 1998)   Procedure: Procedure(s) (LRB): TOTAL HIP ARTHROPLASTY (Right)   Consults: None  HPI: Yvonne Beck, 78 y.o. female, has a history of pain and functional disability in the right hip(s) due to arthritis and patient has failed non-surgical conservative treatments for greater than 12 weeks to include NSAID's and/or analgesics, use of assistive devices and activity modification. Onset of symptoms was gradual starting 2 years ago with gradually worsening course since that time.The patient noted no past surgery on the right hip(s). Patient currently rates pain in the right hip at 8 out of 10 with activity. Patient has night pain, worsening of pain with activity and weight bearing, pain that interfers with activities of daily living and pain with passive range of motion. Patient has evidence of subchondral cysts, subchondral sclerosis, periarticular osteophytes and joint space narrowing by imaging studies. This condition presents safety issues increasing the risk of falls. There is no current active infection.   Laboratory Data: Admission on 02/10/2012  Component Date Value Range Status  . Prothrombin Time 02/10/2012 13.2  11.6 - 15.2 seconds Final  . INR 02/10/2012 1.01  0.00 - 1.49  Final  . aPTT 02/10/2012 26  24 - 37 seconds Final  . Color, Urine 02/10/2012 YELLOW  YELLOW Final  . APPearance 02/10/2012 CLEAR  CLEAR Final  . Specific Gravity, Urine 02/10/2012 1.028  1.005 - 1.030 Final  . pH 02/10/2012 5.0  5.0 - 8.0 Final  . Glucose, UA 02/10/2012 NEGATIVE  NEGATIVE mg/dL Final  . Hgb urine dipstick 02/10/2012 NEGATIVE  NEGATIVE Final  . Bilirubin Urine 02/10/2012 NEGATIVE  NEGATIVE Final  . Ketones, ur 02/10/2012 NEGATIVE  NEGATIVE mg/dL Final  . Protein, ur 40/98/1191 NEGATIVE  NEGATIVE mg/dL Final  . Urobilinogen, UA 02/10/2012 0.2  0.0 - 1.0 mg/dL Final  . Nitrite 47/82/9562 NEGATIVE  NEGATIVE Final  . Leukocytes, UA 02/10/2012 NEGATIVE  NEGATIVE Final   MICROSCOPIC NOT DONE ON URINES WITH NEGATIVE PROTEIN, BLOOD, LEUKOCYTES, NITRITE, OR GLUCOSE <1000 mg/dL.  . ABO/RH(D) 02/10/2012 B POS   Final  . Antibody Screen 02/10/2012 NEG   Final  . Sample Expiration 02/10/2012 02/13/2012   Final  . MRSA, PCR 02/10/2012 NEGATIVE  NEGATIVE Final  . Staphylococcus aureus 02/10/2012 NEGATIVE  NEGATIVE Final   Comment:                                 The Xpert SA Assay (FDA                          approved for NASAL specimens  in patients over 69 years of age),                          is one component of                          a comprehensive surveillance                          program.  Test performance has                          been validated by [employer] for patients greater                          than or equal to 21 year old.                          It is not intended                          to diagnose infection nor to                          guide or monitor treatment.  . ABO/RH(D) 02/10/2012 B POS   Final  . Specimen Description 02/10/2012 URINE, CATHETERIZED   Final  . Special Requests 02/10/2012 NONE   Final  . Culture  Setup Time 02/10/2012 02/11/2012 03:11   Final  . Colony Count 02/10/2012  NO GROWTH   Final  . Culture 02/10/2012 NO GROWTH   Final  . Report Status 02/10/2012 02/12/2012 FINAL   Final  . WBC 02/11/2012 8.2  4.0 - 10.5 K/uL Final  . RBC 02/11/2012 3.11* 3.87 - 5.11 MIL/uL Final  . Hemoglobin 02/11/2012 9.9* 12.0 - 15.0 g/dL Final  . HCT 16/12/9602 30.0* 36.0 - 46.0 % Final  . MCV 02/11/2012 96.5  78.0 - 100.0 fL Final  . MCH 02/11/2012 31.8  26.0 - 34.0 pg Final  . MCHC 02/11/2012 33.0  30.0 - 36.0 g/dL Final  . RDW 54/11/8117 13.3  11.5 - 15.5 % Final  . Platelets 02/11/2012 204  150 - 400 K/uL Final  . Sodium 02/11/2012 136  135 - 145 mEq/L Final  . Potassium 02/11/2012 4.5  3.5 - 5.1 mEq/L Final  . Chloride 02/11/2012 100  96 - 112 mEq/L Final  . CO2 02/11/2012 29  19 - 32 mEq/L Final  . Glucose, Bld 02/11/2012 108* 70 - 99 mg/dL Final  . BUN 14/78/2956 17  6 - 23 mg/dL Final  . Creatinine, Ser 02/11/2012 1.25* 0.50 - 1.10 mg/dL Final  . Calcium 21/30/8657 8.5  8.4 - 10.5 mg/dL Final  . GFR calc non Af Amer 02/11/2012 38* >90 mL/min Final  . GFR calc Af Amer 02/11/2012 44* >90 mL/min Final   Comment:                                 The eGFR has been calculated  using the CKD EPI equation.                          This calculation has not been                          validated in all clinical                          situations.                          eGFR's persistently                          <90 mL/min signify                          possible Chronic Kidney Disease.  . WBC 02/12/2012 10.4  4.0 - 10.5 K/uL Final  . RBC 02/12/2012 2.79* 3.87 - 5.11 MIL/uL Final  . Hemoglobin 02/12/2012 8.9* 12.0 - 15.0 g/dL Final  . HCT 16/12/9602 26.3* 36.0 - 46.0 % Final  . MCV 02/12/2012 94.3  78.0 - 100.0 fL Final  . MCH 02/12/2012 31.9  26.0 - 34.0 pg Final  . MCHC 02/12/2012 33.8  30.0 - 36.0 g/dL Final  . RDW 54/11/8117 13.1  11.5 - 15.5 % Final  . Platelets 02/12/2012 185  150 - 400 K/uL Final  . Sodium 02/12/2012 132* 135 -  145 mEq/L Final  . Potassium 02/12/2012 4.1  3.5 - 5.1 mEq/L Final  . Chloride 02/12/2012 97  96 - 112 mEq/L Final  . CO2 02/12/2012 28  19 - 32 mEq/L Final  . Glucose, Bld 02/12/2012 99  70 - 99 mg/dL Final  . BUN 14/78/2956 14  6 - 23 mg/dL Final  . Creatinine, Ser 02/12/2012 1.31* 0.50 - 1.10 mg/dL Final  . Calcium 21/30/8657 8.5  8.4 - 10.5 mg/dL Final  . GFR calc non Af Amer 02/12/2012 36* >90 mL/min Final  . GFR calc Af Amer 02/12/2012 41* >90 mL/min Final   Comment:                                 The eGFR has been calculated                          using the CKD EPI equation.                          This calculation has not been                          validated in all clinical                          situations.                          eGFR's persistently                          <90 mL/min signify  possible Chronic Kidney Disease.  Marland Kitchen Hemoglobin 02/12/2012 9.2* 12.0 - 15.0 g/dL Final  . HCT 16/12/9602 26.9* 36.0 - 46.0 % Final  . WBC 02/13/2012 9.9  4.0 - 10.5 K/uL Final  . RBC 02/13/2012 2.53* 3.87 - 5.11 MIL/uL Final  . Hemoglobin 02/13/2012 8.2* 12.0 - 15.0 g/dL Final  . HCT 54/11/8117 23.6* 36.0 - 46.0 % Final  . MCV 02/13/2012 93.3  78.0 - 100.0 fL Final  . MCH 02/13/2012 32.4  26.0 - 34.0 pg Final  . MCHC 02/13/2012 34.7  30.0 - 36.0 g/dL Final  . RDW 14/78/2956 13.0  11.5 - 15.5 % Final  . Platelets 02/13/2012 166  150 - 400 K/uL Final  . Prothrombin Time 02/13/2012 18.2* 11.6 - 15.2 seconds Final  . INR 02/13/2012 1.56* 0.00 - 1.49 Final  . Hemoglobin 02/13/2012 8.8* 12.0 - 15.0 g/dL Final  . HCT 21/30/8657 25.1* 36.0 - 46.0 % Final  . Prothrombin Time 02/14/2012 14.6  11.6 - 15.2 seconds Final  . INR 02/14/2012 1.16  0.00 - 1.49 Final     X-Rays:Dg Chest 2 View  02/10/2012  *RADIOLOGY REPORT*  Clinical Data: Preoperative evaluation.  CHEST - 2 VIEW  Comparison: No priors.  Findings: No acute consolidative airspace disease.  Mild  diffuse interstitial prominence, which at least in part appears to be related to some mild diffuse bronchial wall thickening.  No definite pleural effusions or suspicious appearing pulmonary nodules or masses are identified.  Pulmonary vasculature is normal. Heart size is normal.  Mediastinal contours are unremarkable. Atherosclerosis in the thoracic aorta.  Mild bilateral apical pleuroparenchymal thickening, presumably scarring.  IMPRESSION: 1.  No radiographic evidence of acute cardiopulmonary disease. 2.  Mild diffuse interstitial prominence.  At least part of this appears to be related to some patchy areas of bronchial wall thickening.  These findings may be chronic (no priors are available for comparison), but clinical correlation for signs and symptoms of acute bronchitis is recommended. 3.  Atherosclerosis.   Original Report Authenticated By: Trudie Reed, M.D.    Dg Hip Portable 1 View Right  02/10/2012  *RADIOLOGY REPORT*  Clinical Data: Postop for total hip arthroplasty.  PORTABLE RIGHT HIP - 1 VIEW  Comparison: None.  Findings: Right hip arthroplasty.  No periprosthetic fracture or acute hardware complication identified.  Postoperative air and surgical staples project about the hip.  IMPRESSION: Expected appearance after right hip arthroplasty.   Original Report Authenticated By: Jeronimo Greaves, M.D.       Hospital Course: Patient was admitted to [facility] and taken to the OR and underwent the above state procedure without complications.  Patient tolerated the procedure well and was later transferred to the recovery room and then to the orthopaedic floor for postoperative care.  They were given PO and IV analgesics for pain control following their surgery.  They were given 24 hours of postoperative antibiotics of  Anti-infectives     Start     Dose/Rate Route Frequency Ordered Stop   02/10/12 2300   ceFAZolin (ANCEF) IVPB 1 g/50 mL premix        1 g 100 mL/hr over 30 Minutes  Intravenous Every 6 hours 02/10/12 2107 02/11/12 0456   02/10/12 1729   polymyxin B 500,000 Units, bacitracin 50,000 Units in sodium chloride irrigation 0.9 % 500 mL irrigation  Status:  Discontinued          As needed 02/10/12 1729 02/10/12 1838   02/10/12 1225   ceFAZolin (ANCEF) IVPB  2 g/50 mL premix        2 g 100 mL/hr over 30 Minutes Intravenous 60 min pre-op 02/10/12 1225 02/10/12 1645         and started on DVT prophylaxis in the form of Coumadin.   PT and OT were ordered for total hip protocol.  The patient was PWB 50% with therapy. Discharge planning was consulted to help with postop disposition and equipment needs.  Patient had a decent night on the evening of surgery and started to get up OOB with therapy on day one.  Hemovac drain was pulled without difficulty.  The knee immobilizer was removed and discontinued.  Continued to work with therapy into day two.  Dressing was changed on day two and the incision was clean and dry.  Patient required in and out cath as she was not voiding on her own. She had foley catheter reinserted. It was removed post op day three and she began voiding on her own that night.  By day four, the patient had progressed with therapy and meeting their goals but require further assistance from SNF.   Patient was seen in rounds and was ready to go to SNF.   Discharge Medications: Prior to Admission medications   Medication Sig Start Date End Date Taking? Authorizing Provider  fluticasone (FLONASE) 50 MCG/ACT nasal spray Place 2 sprays into the nose daily.   Yes Historical Provider, MD  latanoprost (XALATAN) 0.005 % ophthalmic solution Place 1 drop into the right eye every morning.   Yes Historical Provider, MD  lisinopril (PRINIVIL,ZESTRIL) 40 MG tablet Take 40 mg by mouth daily.   Yes Historical Provider, MD  timolol (TIMOPTIC-XR) 0.25 % ophthalmic gel-forming Place 1 drop into the right eye every morning.   Yes Historical Provider, MD  bisacodyl (DULCOLAX) 10  MG suppository Place 1 suppository (10 mg total) rectally daily as needed. 02/14/12   Ladon Heney Tamala Ser, PA  ferrous sulfate 325 (65 FE) MG tablet Take 1 tablet (325 mg total) by mouth 3 (three) times daily after meals. 02/14/12   Lamont Glasscock Tamala Ser, PA  HYDROcodone-acetaminophen (NORCO/VICODIN) 5-325 MG per tablet Take 1-2 tablets by mouth every 4 (four) hours as needed (breakthrough pain). 02/14/12   Analya Louissaint Tamala Ser, PA  methocarbamol (ROBAXIN) 500 MG tablet Take 1 tablet (500 mg total) by mouth every 6 (six) hours as needed. 02/14/12   Reginna Sermeno Tamala Ser, PA  warfarin (COUMADIN) 2.5 MG tablet Take 1 tablet (2.5 mg total) by mouth daily. 02/14/12   Patrica Mendell Tamala Ser, PA    Diet: Cardiac diet Activity:PWB 505 for an additional week No bending hip over 90 degrees- A "L" Angle Do not cross legs Do not let foot roll inward When turning these patients a pillow should be placed between the patient's legs to prevent crossing. Patients should have the affected knee fully extended when trying to sit or stand from all surfaces to prevent excessive hip flexion. When ambulating and turning toward the affected side the affected leg should have the toes turned out prior to moving the walker and the rest of patient's body as to prevent internal rotation/ turning in of the leg. Abduction pillows are the most effective way to prevent a patient from not crossing legs or turning toes in at rest. If an abduction pillow is not ordered placing a regular pillow length wise between the patient's legs is also an effective reminder. It is imperative that these precautions be maintained so that the surgical hip does not  dislocate. Follow-up:in 2 weeks Disposition - Skilled nursing facility Discharged Condition: fair   Discharge Orders    Future Orders Please Complete By Expires   Diet - low sodium heart healthy      Call MD / Call 911      Comments:   If you experience chest pain or  shortness of breath, CALL 911 and be transported to the hospital emergency room.  If you develope a fever above 101 F, pus (white drainage) or increased drainage or redness at the wound, or calf pain, call your surgeon's office.   Constipation Prevention      Comments:   Drink plenty of fluids.  Prune juice may be helpful.  You may use a stool softener, such as Colace (over the counter) 100 mg twice a day.  Use MiraLax (over the counter) for constipation as needed.   Increase activity slowly as tolerated      Discharge instructions      Comments:   Walk with your walker. Weight bearing as instructed- 50% PWB. WBAT starting in 1 week Change your dressing daily. Shower only, no tub bath. Call if any temperatures greater than 101 or any wound complications: [phone] during the day and ask for Dr. Jeannetta Ellis nurse, [facility] [place].   Driving restrictions      Comments:   No driving   Follow the hip precautions as taught in Physical Therapy      Change dressing      Comments:   You may change your dressing daily with sterile 4 x 4 inch gauze dressing and paper tape.       Medication List     As of 02/14/2012  7:18 AM    STOP taking these medications         aspirin EC 81 MG tablet      TAKE these medications         bisacodyl 10 MG suppository   Commonly known as: DULCOLAX   Place 1 suppository (10 mg total) rectally daily as needed.      ferrous sulfate 325 (65 FE) MG tablet   Take 1 tablet (325 mg total) by mouth 3 (three) times daily after meals.      fluticasone 50 MCG/ACT nasal spray   Commonly known as: FLONASE   Place 2 sprays into the nose daily.      HYDROcodone-acetaminophen 5-325 MG per tablet   Commonly known as: NORCO/VICODIN   Take 1-2 tablets by mouth every 4 (four) hours as needed (breakthrough pain).      latanoprost 0.005 % ophthalmic solution   Commonly known as: XALATAN   Place 1 drop into the right eye every morning.      lisinopril 40 MG tablet    Commonly known as: PRINIVIL,ZESTRIL   Take 40 mg by mouth daily.      methocarbamol 500 MG tablet   Commonly known as: ROBAXIN   Take 1 tablet (500 mg total) by mouth every 6 (six) hours as needed.      timolol 0.25 % ophthalmic gel-forming   Commonly known as: TIMOPTIC-XR   Place 1 drop into the right eye every morning.      warfarin 2.5 MG tablet   Commonly known as: COUMADIN   Take 1 tablet (2.5 mg total) by mouth daily.         SignedCeledonio Savage, Adriana Lina LAUREN 02/14/2012, 7:18 AM

## 2012-02-14 NOTE — Progress Notes (Signed)
   Subjective: 4 Days Post-Op Procedure(s) (LRB): TOTAL HIP ARTHROPLASTY (Right) Patient reports pain as mild.   Patient seen in rounds with Dr. Darrelyn Hillock. Patient is well, and has had no acute complaints or problems. She reports that she starting voiding on her own last night. She denies chest pain and shortness of breath. No issues overnight. She is hopeful to go to SNF today.  Plan is to go Skilled nursing facility after hospital stay.  Objective: Vital signs in last 24 hours: Temp:  [98.1 F (36.7 C)-99 F (37.2 C)] 99 F (37.2 C) (11/15 0501) Pulse Rate:  [68-80] 70  (11/15 0501) Resp:  [12-20] 16  (11/15 0501) BP: (110-150)/(65-78) 150/78 mmHg (11/15 0501) SpO2:  [94 %-100 %] 98 % (11/15 0501)  Intake/Output from previous day:  Intake/Output Summary (Last 24 hours) at 02/14/12 0708 Last data filed at 02/14/12 0505  Gross per 24 hour  Intake 1828.33 ml  Output   1950 ml  Net -121.67 ml     Labs:  Basename 02/13/12 1352 02/13/12 0439 02/12/12 1501 02/12/12 0439  HGB 8.8* 8.2* 9.2* 8.9*    Basename 02/13/12 1352 02/13/12 0439 02/12/12 0439  WBC -- 9.9 10.4  RBC -- 2.53* 2.79*  HCT 25.1* 23.6* --  PLT -- 166 185    Basename 02/12/12 0439  NA 132*  K 4.1  CL 97  CO2 28  BUN 14  CREATININE 1.31*  GLUCOSE 99  CALCIUM 8.5    Basename 02/14/12 0433 02/13/12 0439  LABPT -- --  INR 1.16 1.56*    EXAM General - Patient is Alert and Oriented Extremity - Neurologically intact Neurovascular intact Dorsiflexion/Plantar flexion intact No cellulitis present Dressing/Incision - clean, dry, no drainage Motor Function - intact, moving foot and toes well on exam.   Past Medical History  Diagnosis Date  . Hypertension   . Arthritis     Assessment/Plan: 4 Days Post-Op Procedure(s) (LRB): TOTAL HIP ARTHROPLASTY (Right) Active Problems:  Avascular necrosis of femoral head  Postoperative anemia due to acute blood loss  Estimated Body mass index is 22.33  kg/(m^2) as calculated from the following:   Height as of this encounter: 5\' 4" (1.626 m).   Weight as of this encounter: 130 lb 1.1 oz(59 kg). Advance diet Up with therapy D/C IV fluids Discharge to SNF  DVT Prophylaxis - Coumadin 50% PWB left leg  She is going to discharged to SNF today pending placement. We will see her back in the office in 2 weeks.   Davi Rotan LAUREN 02/14/2012, 7:08 AM

## 2015-04-26 DIAGNOSIS — H401113 Primary open-angle glaucoma, right eye, severe stage: Secondary | ICD-10-CM | POA: Diagnosis not present

## 2015-11-07 DIAGNOSIS — H401113 Primary open-angle glaucoma, right eye, severe stage: Secondary | ICD-10-CM | POA: Diagnosis not present

## 2015-11-07 DIAGNOSIS — Z961 Presence of intraocular lens: Secondary | ICD-10-CM | POA: Diagnosis not present

## 2016-04-25 DIAGNOSIS — I10 Essential (primary) hypertension: Secondary | ICD-10-CM | POA: Diagnosis not present

## 2016-04-25 DIAGNOSIS — J3089 Other allergic rhinitis: Secondary | ICD-10-CM | POA: Diagnosis not present

## 2016-05-08 DIAGNOSIS — H401132 Primary open-angle glaucoma, bilateral, moderate stage: Secondary | ICD-10-CM | POA: Diagnosis not present

## 2016-06-26 DIAGNOSIS — N289 Disorder of kidney and ureter, unspecified: Secondary | ICD-10-CM | POA: Diagnosis not present

## 2016-07-02 DIAGNOSIS — M25552 Pain in left hip: Secondary | ICD-10-CM | POA: Diagnosis not present

## 2016-07-13 DIAGNOSIS — M545 Low back pain: Secondary | ICD-10-CM | POA: Diagnosis not present

## 2016-07-15 ENCOUNTER — Encounter (HOSPITAL_COMMUNITY): Payer: Self-pay | Admitting: Emergency Medicine

## 2016-07-15 ENCOUNTER — Emergency Department (HOSPITAL_COMMUNITY)
Admission: EM | Admit: 2016-07-15 | Discharge: 2016-07-15 | Disposition: A | Discharge status: Home / Self Care | Payer: PPO | Attending: Emergency Medicine | Admitting: Emergency Medicine

## 2016-07-15 ENCOUNTER — Emergency Department (HOSPITAL_COMMUNITY): Payer: PPO

## 2016-07-15 DIAGNOSIS — S3993XA Unspecified injury of pelvis, initial encounter: Secondary | ICD-10-CM | POA: Diagnosis not present

## 2016-07-15 DIAGNOSIS — Z79899 Other long term (current) drug therapy: Secondary | ICD-10-CM | POA: Diagnosis not present

## 2016-07-15 DIAGNOSIS — W1830XA Fall on same level, unspecified, initial encounter: Secondary | ICD-10-CM | POA: Insufficient documentation

## 2016-07-15 DIAGNOSIS — Z7901 Long term (current) use of anticoagulants: Secondary | ICD-10-CM | POA: Diagnosis not present

## 2016-07-15 DIAGNOSIS — S32512A Fracture of superior rim of left pubis, initial encounter for closed fracture: Secondary | ICD-10-CM | POA: Diagnosis not present

## 2016-07-15 DIAGNOSIS — Z87891 Personal history of nicotine dependence: Secondary | ICD-10-CM | POA: Insufficient documentation

## 2016-07-15 DIAGNOSIS — S32592A Other specified fracture of left pubis, initial encounter for closed fracture: Secondary | ICD-10-CM

## 2016-07-15 DIAGNOSIS — Z96641 Presence of right artificial hip joint: Secondary | ICD-10-CM | POA: Insufficient documentation

## 2016-07-15 DIAGNOSIS — Y939 Activity, unspecified: Secondary | ICD-10-CM | POA: Diagnosis not present

## 2016-07-15 DIAGNOSIS — Y999 Unspecified external cause status: Secondary | ICD-10-CM | POA: Diagnosis not present

## 2016-07-15 DIAGNOSIS — Y929 Unspecified place or not applicable: Secondary | ICD-10-CM | POA: Diagnosis not present

## 2016-07-15 DIAGNOSIS — I10 Essential (primary) hypertension: Secondary | ICD-10-CM | POA: Diagnosis not present

## 2016-07-15 DIAGNOSIS — S32502A Unspecified fracture of left pubis, initial encounter for closed fracture: Secondary | ICD-10-CM | POA: Diagnosis not present

## 2016-07-15 MED ORDER — HYDROCODONE-ACETAMINOPHEN 5-325 MG PO TABS: 1.0000 | ORAL_TABLET | Freq: Four times a day (QID) | ORAL | 0 refills | Status: DC | PRN

## 2016-07-15 NOTE — ED Notes (Signed)
ED Provider at bedside. 

## 2016-07-15 NOTE — ED Triage Notes (Signed)
Per EMS pt from home, lives with son.  Reports right hip replacement 3 years ago. Pt had fall 1 month ago-saw PCP and had xrays without abnormalities.  Patient continued to have pain and was seen again with no abnormalities at PCP.  Reports over the past week she has had pain in right buttock when up walking with walker.  Per EMS no deformities, rotation of leg

## 2016-07-15 NOTE — ED Notes (Signed)
Patient's son was at the bedside but needed to leave.  His name is Taegen Lennox and can be reached at [phone].

## 2016-07-15 NOTE — ED Notes (Signed)
Bed: WA09 Expected date:  Expected time:  Means of arrival:  Comments: EMS 83 yo, pain

## 2016-07-15 NOTE — ED Notes (Signed)
Bed: [facility]ALA Expected date:  Expected time:  Means of arrival:  Comments: No bed. 

## 2016-07-15 NOTE — ED Provider Notes (Signed)
[facility]-EMERGENCY DEPT Provider Note   CSN: [id number] Arrival date & time: 07/15/16  1032     History   Chief Complaint Chief Complaint  Patient presents with  . Back Pain    HPI Yvonne Beck is a 83 y.o. female.  83 year old female presents with several days of right sciatic pain radiating to her pelvis. Denies any urinary symptoms. Seen by her orthopedist 2 days ago for similar symptoms and I spoke with him and she had negative x-rays in the office. She denies any numbness or tingling to her right foot. No back pain. No bowel or bladder dysfunction. Does have a history of right-sided total hip replacement. Denies any new injury. Has been taking Tylenol with limited relief. Per the orthopedist, requests a CT scan to evaluate for possible pubic rami fracture.      Past Medical History:  Diagnosis Date  . Arthritis   . Hypertension     Patient Active Problem List   Diagnosis Date Noted  . Postoperative anemia due to acute blood loss 02/11/2012  . Avascular necrosis of femoral head (HCC) 02/10/2012    Past Surgical History:  Procedure Laterality Date  . NO PAST SURGERIES    . TOTAL HIP ARTHROPLASTY  02/10/2012   Procedure: TOTAL HIP ARTHROPLASTY;  Surgeon: Jacki Cones, MD;  Location: [facility];  Service: Orthopedics;  Laterality: Right;    OB History    No data available       Home Medications    Prior to Admission medications   Medication Sig Start Date End Date Taking? Authorizing Provider  bisacodyl (DULCOLAX) 10 MG suppository Place 1 suppository (10 mg total) rectally daily as needed. 02/14/12   Amber Celedonio Savage, PA-C  ferrous sulfate 325 (65 FE) MG tablet Take 1 tablet (325 mg total) by mouth 3 (three) times daily after meals. 02/14/12   Amber Constable, PA-C  fluticasone (FLONASE) 50 MCG/ACT nasal spray Place 2 sprays into the nose daily.    Historical Provider, MD  HYDROcodone-acetaminophen (NORCO/VICODIN) 5-325 MG per tablet Take 1-2 tablets by mouth  every 4 (four) hours as needed (breakthrough pain). 02/14/12   Amber Constable, PA-C  latanoprost (XALATAN) 0.005 % ophthalmic solution Place 1 drop into the right eye every morning.    Historical Provider, MD  lisinopril (PRINIVIL,ZESTRIL) 40 MG tablet Take 40 mg by mouth daily.    Historical Provider, MD  methocarbamol (ROBAXIN) 500 MG tablet Take 1 tablet (500 mg total) by mouth every 6 (six) hours as needed. 02/14/12   Amber Constable, PA-C  timolol (TIMOPTIC-XR) 0.25 % ophthalmic gel-forming Place 1 drop into the right eye every morning.    Historical Provider, MD  warfarin (COUMADIN) 2.5 MG tablet Take 1 tablet (2.5 mg total) by mouth daily. 02/14/12   Dimitri Ped, PA-C    Family History History reviewed. No pertinent family history.  Social History Social History  Substance Use Topics  . Smoking status: Former Games developer  . Smokeless tobacco: Never Used  . Alcohol use No     Allergies   Patient has no known allergies.   Review of Systems Review of Systems  All other systems reviewed and are negative.    Physical Exam Updated Vital Signs BP (!) 193/88 (BP Location: Right Arm)   Pulse 68   Temp 97.9 F (36.6 C) (Oral)   Resp 16   Ht  (1.676 m)   Wt 62.1 kg   SpO2 96%   BMI 22.11 kg/m   Physical Exam  Constitutional: She is oriented to person, place, and time. She appears well-developed and well-nourished.  Non-toxic appearance. No distress.  HENT:  Head: Normocephalic and atraumatic.  Eyes: Conjunctivae, EOM and lids are normal. Pupils are equal, round, and reactive to light.  Neck: Normal range of motion. Neck supple. No tracheal deviation present. No thyroid mass present.  Cardiovascular: Normal rate, regular rhythm and normal heart sounds.  Exam reveals no gallop.   No murmur heard. Pulmonary/Chest: Effort normal and breath sounds normal. No stridor. No respiratory distress. She has no decreased breath sounds. She has no wheezes. She has no rhonchi. She  has no rales.  Abdominal: Soft. Normal appearance and bowel sounds are normal. She exhibits no distension. There is no tenderness. There is no rebound and no CVA tenderness.  Musculoskeletal: Normal range of motion. She exhibits no edema or tenderness.       Legs: Neurological: She is alert and oriented to person, place, and time. She has normal strength. No cranial nerve deficit or sensory deficit. GCS eye subscore is 4. GCS verbal subscore is 5. GCS motor subscore is 6.  Skin: Skin is warm and dry. No abrasion and no rash noted.  Psychiatric: She has a normal mood and affect. Her speech is normal and behavior is normal.  Nursing note and vitals reviewed.    ED Treatments / Results  Labs (all labs ordered are listed, but only abnormal results are displayed) Labs Reviewed - No data to display  EKG  EKG Interpretation None       Radiology No results found.  Procedures Procedures (including critical care time)  Medications Ordered in ED Medications - No data to display   Initial Impression / Assessment and Plan / ED Course  I have reviewed the triage vital signs and the nursing notes.  Pertinent labs & imaging results that were available during my care of the patient were reviewed by me and considered in my medical decision making (see chart for details).     Case discussed with her orthopedist, Dr.goifree, he was here in the office for follow-up for her pubic rami fractures. Will medicate her for pain.  Final Clinical Impressions(s) / ED Diagnoses   Final diagnoses:  None    New Prescriptions New Prescriptions   No medications on file     Lorre Nick, MD 07/15/16 1351

## 2016-07-15 NOTE — Discharge Instructions (Signed)
Follow-up with Dr.Giofree this week

## 2016-07-15 NOTE — ED Notes (Signed)
Patient transported to CT 

## 2016-07-24 DIAGNOSIS — Z111 Encounter for screening for respiratory tuberculosis: Secondary | ICD-10-CM | POA: Diagnosis not present

## 2016-08-08 DIAGNOSIS — E785 Hyperlipidemia, unspecified: Secondary | ICD-10-CM | POA: Diagnosis not present

## 2016-08-08 DIAGNOSIS — J449 Chronic obstructive pulmonary disease, unspecified: Secondary | ICD-10-CM | POA: Diagnosis not present

## 2016-08-08 DIAGNOSIS — Z7982 Long term (current) use of aspirin: Secondary | ICD-10-CM | POA: Diagnosis not present

## 2016-08-08 DIAGNOSIS — F419 Anxiety disorder, unspecified: Secondary | ICD-10-CM | POA: Diagnosis not present

## 2016-08-08 DIAGNOSIS — Z7951 Long term (current) use of inhaled steroids: Secondary | ICD-10-CM | POA: Diagnosis not present

## 2016-08-08 DIAGNOSIS — I10 Essential (primary) hypertension: Secondary | ICD-10-CM | POA: Diagnosis not present

## 2016-08-08 DIAGNOSIS — S81812D Laceration without foreign body, left lower leg, subsequent encounter: Secondary | ICD-10-CM | POA: Diagnosis not present

## 2016-08-08 DIAGNOSIS — H9190 Unspecified hearing loss, unspecified ear: Secondary | ICD-10-CM | POA: Diagnosis not present

## 2016-08-12 DIAGNOSIS — J449 Chronic obstructive pulmonary disease, unspecified: Secondary | ICD-10-CM | POA: Diagnosis not present

## 2016-08-12 DIAGNOSIS — Z7951 Long term (current) use of inhaled steroids: Secondary | ICD-10-CM | POA: Diagnosis not present

## 2016-08-12 DIAGNOSIS — S81812D Laceration without foreign body, left lower leg, subsequent encounter: Secondary | ICD-10-CM | POA: Diagnosis not present

## 2016-08-12 DIAGNOSIS — H9190 Unspecified hearing loss, unspecified ear: Secondary | ICD-10-CM | POA: Diagnosis not present

## 2016-08-12 DIAGNOSIS — I10 Essential (primary) hypertension: Secondary | ICD-10-CM | POA: Diagnosis not present

## 2016-08-12 DIAGNOSIS — E785 Hyperlipidemia, unspecified: Secondary | ICD-10-CM | POA: Diagnosis not present

## 2016-08-12 DIAGNOSIS — Z7982 Long term (current) use of aspirin: Secondary | ICD-10-CM | POA: Diagnosis not present

## 2016-08-12 DIAGNOSIS — F419 Anxiety disorder, unspecified: Secondary | ICD-10-CM | POA: Diagnosis not present

## 2016-08-19 DIAGNOSIS — Z7951 Long term (current) use of inhaled steroids: Secondary | ICD-10-CM | POA: Diagnosis not present

## 2016-08-19 DIAGNOSIS — I10 Essential (primary) hypertension: Secondary | ICD-10-CM | POA: Diagnosis not present

## 2016-08-19 DIAGNOSIS — S81812D Laceration without foreign body, left lower leg, subsequent encounter: Secondary | ICD-10-CM | POA: Diagnosis not present

## 2016-08-19 DIAGNOSIS — E785 Hyperlipidemia, unspecified: Secondary | ICD-10-CM | POA: Diagnosis not present

## 2016-08-19 DIAGNOSIS — F419 Anxiety disorder, unspecified: Secondary | ICD-10-CM | POA: Diagnosis not present

## 2016-08-19 DIAGNOSIS — J449 Chronic obstructive pulmonary disease, unspecified: Secondary | ICD-10-CM | POA: Diagnosis not present

## 2016-08-19 DIAGNOSIS — H9190 Unspecified hearing loss, unspecified ear: Secondary | ICD-10-CM | POA: Diagnosis not present

## 2016-08-19 DIAGNOSIS — Z7982 Long term (current) use of aspirin: Secondary | ICD-10-CM | POA: Diagnosis not present

## 2016-08-26 DIAGNOSIS — Z7982 Long term (current) use of aspirin: Secondary | ICD-10-CM | POA: Diagnosis not present

## 2016-08-26 DIAGNOSIS — Z7951 Long term (current) use of inhaled steroids: Secondary | ICD-10-CM | POA: Diagnosis not present

## 2016-08-26 DIAGNOSIS — E785 Hyperlipidemia, unspecified: Secondary | ICD-10-CM | POA: Diagnosis not present

## 2016-08-26 DIAGNOSIS — I10 Essential (primary) hypertension: Secondary | ICD-10-CM | POA: Diagnosis not present

## 2016-08-26 DIAGNOSIS — H9190 Unspecified hearing loss, unspecified ear: Secondary | ICD-10-CM | POA: Diagnosis not present

## 2016-08-26 DIAGNOSIS — S81812D Laceration without foreign body, left lower leg, subsequent encounter: Secondary | ICD-10-CM | POA: Diagnosis not present

## 2016-08-26 DIAGNOSIS — F419 Anxiety disorder, unspecified: Secondary | ICD-10-CM | POA: Diagnosis not present

## 2016-08-26 DIAGNOSIS — J449 Chronic obstructive pulmonary disease, unspecified: Secondary | ICD-10-CM | POA: Diagnosis not present

## 2016-09-02 DIAGNOSIS — E785 Hyperlipidemia, unspecified: Secondary | ICD-10-CM | POA: Diagnosis not present

## 2016-09-02 DIAGNOSIS — F419 Anxiety disorder, unspecified: Secondary | ICD-10-CM | POA: Diagnosis not present

## 2016-09-02 DIAGNOSIS — Z7951 Long term (current) use of inhaled steroids: Secondary | ICD-10-CM | POA: Diagnosis not present

## 2016-09-02 DIAGNOSIS — I10 Essential (primary) hypertension: Secondary | ICD-10-CM | POA: Diagnosis not present

## 2016-09-02 DIAGNOSIS — H9190 Unspecified hearing loss, unspecified ear: Secondary | ICD-10-CM | POA: Diagnosis not present

## 2016-09-02 DIAGNOSIS — Z7952 Long term (current) use of systemic steroids: Secondary | ICD-10-CM | POA: Diagnosis not present

## 2016-09-02 DIAGNOSIS — J449 Chronic obstructive pulmonary disease, unspecified: Secondary | ICD-10-CM | POA: Diagnosis not present

## 2016-09-02 DIAGNOSIS — S81812D Laceration without foreign body, left lower leg, subsequent encounter: Secondary | ICD-10-CM | POA: Diagnosis not present

## 2016-09-09 DIAGNOSIS — I10 Essential (primary) hypertension: Secondary | ICD-10-CM | POA: Diagnosis not present

## 2016-09-09 DIAGNOSIS — F419 Anxiety disorder, unspecified: Secondary | ICD-10-CM | POA: Diagnosis not present

## 2016-09-09 DIAGNOSIS — J449 Chronic obstructive pulmonary disease, unspecified: Secondary | ICD-10-CM | POA: Diagnosis not present

## 2016-09-09 DIAGNOSIS — Z7951 Long term (current) use of inhaled steroids: Secondary | ICD-10-CM | POA: Diagnosis not present

## 2016-09-09 DIAGNOSIS — E785 Hyperlipidemia, unspecified: Secondary | ICD-10-CM | POA: Diagnosis not present

## 2016-09-09 DIAGNOSIS — Z7982 Long term (current) use of aspirin: Secondary | ICD-10-CM | POA: Diagnosis not present

## 2016-09-09 DIAGNOSIS — S81812D Laceration without foreign body, left lower leg, subsequent encounter: Secondary | ICD-10-CM | POA: Diagnosis not present

## 2016-09-09 DIAGNOSIS — H9190 Unspecified hearing loss, unspecified ear: Secondary | ICD-10-CM | POA: Diagnosis not present

## 2016-09-16 DIAGNOSIS — Z7982 Long term (current) use of aspirin: Secondary | ICD-10-CM | POA: Diagnosis not present

## 2016-09-16 DIAGNOSIS — J449 Chronic obstructive pulmonary disease, unspecified: Secondary | ICD-10-CM | POA: Diagnosis not present

## 2016-09-16 DIAGNOSIS — H9190 Unspecified hearing loss, unspecified ear: Secondary | ICD-10-CM | POA: Diagnosis not present

## 2016-09-16 DIAGNOSIS — E785 Hyperlipidemia, unspecified: Secondary | ICD-10-CM | POA: Diagnosis not present

## 2016-09-16 DIAGNOSIS — F419 Anxiety disorder, unspecified: Secondary | ICD-10-CM | POA: Diagnosis not present

## 2016-09-16 DIAGNOSIS — Z7951 Long term (current) use of inhaled steroids: Secondary | ICD-10-CM | POA: Diagnosis not present

## 2016-09-16 DIAGNOSIS — S81812D Laceration without foreign body, left lower leg, subsequent encounter: Secondary | ICD-10-CM | POA: Diagnosis not present

## 2016-09-16 DIAGNOSIS — I10 Essential (primary) hypertension: Secondary | ICD-10-CM | POA: Diagnosis not present

## 2016-09-23 DIAGNOSIS — Z7982 Long term (current) use of aspirin: Secondary | ICD-10-CM | POA: Diagnosis not present

## 2016-09-23 DIAGNOSIS — S81812D Laceration without foreign body, left lower leg, subsequent encounter: Secondary | ICD-10-CM | POA: Diagnosis not present

## 2016-09-23 DIAGNOSIS — F419 Anxiety disorder, unspecified: Secondary | ICD-10-CM | POA: Diagnosis not present

## 2016-09-23 DIAGNOSIS — J449 Chronic obstructive pulmonary disease, unspecified: Secondary | ICD-10-CM | POA: Diagnosis not present

## 2016-09-23 DIAGNOSIS — I10 Essential (primary) hypertension: Secondary | ICD-10-CM | POA: Diagnosis not present

## 2016-09-23 DIAGNOSIS — H9190 Unspecified hearing loss, unspecified ear: Secondary | ICD-10-CM | POA: Diagnosis not present

## 2016-09-23 DIAGNOSIS — E785 Hyperlipidemia, unspecified: Secondary | ICD-10-CM | POA: Diagnosis not present

## 2016-09-23 DIAGNOSIS — Z7951 Long term (current) use of inhaled steroids: Secondary | ICD-10-CM | POA: Diagnosis not present

## 2016-10-03 DIAGNOSIS — S81812D Laceration without foreign body, left lower leg, subsequent encounter: Secondary | ICD-10-CM | POA: Diagnosis not present

## 2016-10-03 DIAGNOSIS — F419 Anxiety disorder, unspecified: Secondary | ICD-10-CM | POA: Diagnosis not present

## 2016-10-03 DIAGNOSIS — J449 Chronic obstructive pulmonary disease, unspecified: Secondary | ICD-10-CM | POA: Diagnosis not present

## 2016-10-03 DIAGNOSIS — E785 Hyperlipidemia, unspecified: Secondary | ICD-10-CM | POA: Diagnosis not present

## 2016-10-03 DIAGNOSIS — Z7951 Long term (current) use of inhaled steroids: Secondary | ICD-10-CM | POA: Diagnosis not present

## 2016-10-03 DIAGNOSIS — I10 Essential (primary) hypertension: Secondary | ICD-10-CM | POA: Diagnosis not present

## 2016-10-03 DIAGNOSIS — Z7982 Long term (current) use of aspirin: Secondary | ICD-10-CM | POA: Diagnosis not present

## 2016-10-03 DIAGNOSIS — H9190 Unspecified hearing loss, unspecified ear: Secondary | ICD-10-CM | POA: Diagnosis not present

## 2016-10-30 DIAGNOSIS — Z961 Presence of intraocular lens: Secondary | ICD-10-CM | POA: Diagnosis not present

## 2016-10-30 DIAGNOSIS — H401113 Primary open-angle glaucoma, right eye, severe stage: Secondary | ICD-10-CM | POA: Diagnosis not present

## 2017-03-04 ENCOUNTER — Emergency Department (HOSPITAL_COMMUNITY): Payer: PPO

## 2017-03-04 ENCOUNTER — Inpatient Hospital Stay (HOSPITAL_COMMUNITY)
Admission: EM | Admit: 2017-03-04 | Discharge: 2017-03-07 | DRG: 684 | Disposition: A | Discharge status: Home Health | Payer: PPO | Attending: Internal Medicine | Admitting: Internal Medicine

## 2017-03-04 ENCOUNTER — Encounter (HOSPITAL_COMMUNITY): Payer: Self-pay

## 2017-03-04 ENCOUNTER — Observation Stay (HOSPITAL_COMMUNITY): Payer: PPO

## 2017-03-04 ENCOUNTER — Other Ambulatory Visit: Payer: Self-pay

## 2017-03-04 DIAGNOSIS — N179 Acute kidney failure, unspecified: Principal | ICD-10-CM | POA: Diagnosis present

## 2017-03-04 DIAGNOSIS — R0602 Shortness of breath: Secondary | ICD-10-CM | POA: Diagnosis not present

## 2017-03-04 DIAGNOSIS — R55 Syncope and collapse: Secondary | ICD-10-CM

## 2017-03-04 DIAGNOSIS — M1990 Unspecified osteoarthritis, unspecified site: Secondary | ICD-10-CM | POA: Diagnosis not present

## 2017-03-04 DIAGNOSIS — I351 Nonrheumatic aortic (valve) insufficiency: Secondary | ICD-10-CM | POA: Diagnosis not present

## 2017-03-04 DIAGNOSIS — E860 Dehydration: Secondary | ICD-10-CM | POA: Diagnosis not present

## 2017-03-04 DIAGNOSIS — R42 Dizziness and giddiness: Secondary | ICD-10-CM | POA: Diagnosis not present

## 2017-03-04 DIAGNOSIS — Z888 Allergy status to other drugs, medicaments and biological substances status: Secondary | ICD-10-CM

## 2017-03-04 DIAGNOSIS — R404 Transient alteration of awareness: Secondary | ICD-10-CM | POA: Diagnosis not present

## 2017-03-04 DIAGNOSIS — Z87891 Personal history of nicotine dependence: Secondary | ICD-10-CM

## 2017-03-04 DIAGNOSIS — N183 Chronic kidney disease, stage 3 (moderate): Secondary | ICD-10-CM | POA: Diagnosis not present

## 2017-03-04 DIAGNOSIS — I129 Hypertensive chronic kidney disease with stage 1 through stage 4 chronic kidney disease, or unspecified chronic kidney disease: Secondary | ICD-10-CM | POA: Diagnosis present

## 2017-03-04 DIAGNOSIS — Z96641 Presence of right artificial hip joint: Secondary | ICD-10-CM | POA: Diagnosis not present

## 2017-03-04 DIAGNOSIS — F419 Anxiety disorder, unspecified: Secondary | ICD-10-CM | POA: Diagnosis present

## 2017-03-04 DIAGNOSIS — H409 Unspecified glaucoma: Secondary | ICD-10-CM | POA: Diagnosis not present

## 2017-03-04 DIAGNOSIS — R531 Weakness: Secondary | ICD-10-CM | POA: Diagnosis not present

## 2017-03-04 DIAGNOSIS — I10 Essential (primary) hypertension: Secondary | ICD-10-CM | POA: Diagnosis present

## 2017-03-04 HISTORY — DX: Unspecified glaucoma: H40.9

## 2017-03-04 HISTORY — DX: Anxiety disorder, unspecified: F41.9

## 2017-03-04 LAB — TROPONIN I
Troponin I: <0.03 ng/mL (ref <0.03)
Troponin I: <0.03 ng/mL (ref <0.03)

## 2017-03-04 LAB — BASIC METABOLIC PANEL
ANION GAP: 10 (ref 5–15)
BUN: 29 mg/dL — ABNORMAL HIGH (ref 6–20)
CALCIUM: 9.4 mg/dL (ref 8.9–10.3)
CO2: 24 mmol/L (ref 22–32)
CREATININE: 1.53 mg/dL — AB (ref 0.44–1.00)
Chloride: 104 mmol/L (ref 101–111)
GFR calc Af Amer: 33 mL/min — ABNORMAL LOW (ref >60)
GFR, EST NON AFRICAN AMERICAN: 29 mL/min — AB (ref >60)
Glucose, Bld: 98 mg/dL (ref 65–99)
Potassium: 4.7 mmol/L (ref 3.5–5.1)
Sodium: 138 mmol/L (ref 135–145)

## 2017-03-04 LAB — CBC
HCT: 40.3 % (ref 36.0–46.0)
HEMOGLOBIN: 13.2 g/dL (ref 12.0–15.0)
MCH: 31.7 pg (ref 26.0–34.0)
MCHC: 32.8 g/dL (ref 30.0–36.0)
MCV: 96.9 fL (ref 78.0–100.0)
Platelets: 222 10*3/uL (ref 150–400)
RBC: 4.16 MIL/uL (ref 3.87–5.11)
RDW: 14.2 % (ref 11.5–15.5)
WBC: 5.7 10*3/uL (ref 4.0–10.5)

## 2017-03-04 LAB — URINALYSIS, ROUTINE W REFLEX MICROSCOPIC
BACTERIA UA: NONE SEEN
Bilirubin Urine: NEGATIVE
GLUCOSE, UA: NEGATIVE mg/dL
Hgb urine dipstick: NEGATIVE
Ketones, ur: NEGATIVE mg/dL
Nitrite: NEGATIVE
PROTEIN: 100 mg/dL — AB
SPECIFIC GRAVITY, URINE: 1.006 (ref 1.005–1.030)
pH: 7 (ref 5.0–8.0)

## 2017-03-04 LAB — PROTIME-INR
INR: 1
PROTHROMBIN TIME: 13.1 s (ref 11.4–15.2)

## 2017-03-04 LAB — CBG MONITORING, ED: GLUCOSE-CAPILLARY: 95 mg/dL (ref 65–99)

## 2017-03-04 MED ORDER — ENOXAPARIN SODIUM 30 MG/0.3ML ~~LOC~~ SOLN
30.0000 mg | SUBCUTANEOUS | Status: DC
  Administered 2017-03-04 – 2017-03-06 (×2): 30 mg via SUBCUTANEOUS
  Filled 2017-03-04 (×2): qty 0.3

## 2017-03-04 MED ORDER — ALPRAZOLAM 0.25 MG PO TABS
0.2500 mg | ORAL_TABLET | Freq: Three times a day (TID) | ORAL | Status: DC | PRN
  Administered 2017-03-05: 0.25 mg via ORAL
  Filled 2017-03-04: qty 1

## 2017-03-04 MED ORDER — SODIUM CHLORIDE 0.9% FLUSH
3.0000 mL | Freq: Two times a day (BID) | INTRAVENOUS | Status: DC
  Administered 2017-03-05 – 2017-03-07 (×4): 3 mL via INTRAVENOUS

## 2017-03-04 MED ORDER — LISINOPRIL 20 MG PO TABS
40.0000 mg | ORAL_TABLET | Freq: Once | ORAL | Status: DC
  Filled 2017-03-04: qty 2

## 2017-03-04 MED ORDER — SODIUM CHLORIDE 0.9 % IV SOLN
INTRAVENOUS | Status: DC
  Administered 2017-03-04 – 2017-03-06 (×2): via INTRAVENOUS

## 2017-03-04 MED ORDER — HYDRALAZINE HCL 20 MG/ML IJ SOLN
20.0000 mg | INTRAMUSCULAR | Status: DC | PRN
  Administered 2017-03-04: 20 mg via INTRAVENOUS
  Filled 2017-03-04: qty 1

## 2017-03-04 MED ORDER — SODIUM CHLORIDE 0.9 % IV BOLUS (SEPSIS)
500.0000 mL | Freq: Once | INTRAVENOUS | Status: AC
  Administered 2017-03-04: 500 mL via INTRAVENOUS

## 2017-03-04 MED ORDER — HYDRALAZINE HCL 20 MG/ML IJ SOLN
10.0000 mg | INTRAMUSCULAR | Status: DC | PRN
  Filled 2017-03-04: qty 1

## 2017-03-04 NOTE — ED Notes (Signed)
Checked patient blood sugar it was 85 notifed RN of blood sugar patient is resting with family at bedside and call bell in reach

## 2017-03-04 NOTE — ED Provider Notes (Signed)
[facility] EMERGENCY DEPARTMENT Provider Note   CSN: [id number] Arrival date & time: 03/04/17  4098     History   Chief Complaint Chief Complaint  Patient presents with  . Weakness  . Dizziness    HPI HAILEA EAGLIN is a 83 y.o. female.  83 year old female with a prior history of hypertension, AKI, and anxiety resents following up.  Patient reports that she awoke from sleep and went to the bathroom.  Enroute to the bathroom the patient began to feel lightheaded --  like she might pass out.  The patient did not actually pass out.  She was able to return to her bed where she was able to lie down.  She denies associated chest pain, shortness of breath, nausea, vomiting, or other complaint.  She reports that after returning to bed she had at least one more episode where she felt like she might pass out  -- as she was lying down.  She now feels improved.  She denies symptoms at this time.  She reports this has not happened to her in the past.   The history is provided by the patient.  Weakness  Primary symptoms include dizziness. This is a new problem. The current episode started 1 to 2 hours ago. The problem has been resolved. There was no focality noted. There has been no fever.  Dizziness  Associated symptoms: weakness     Past Medical History:  Diagnosis Date  . Arthritis   . Hypertension     Patient Active Problem List   Diagnosis Date Noted  . Acute kidney injury (HCC) 03/04/2017  . Near syncope 03/04/2017  . Acute anxiety 03/04/2017  . Uncontrolled hypertension 03/04/2017  . Postoperative anemia due to acute blood loss 02/11/2012  . Avascular necrosis of femoral head (HCC) 02/10/2012    Past Surgical History:  Procedure Laterality Date  . NO PAST SURGERIES    . TOTAL HIP ARTHROPLASTY  02/10/2012   Procedure: TOTAL HIP ARTHROPLASTY;  Surgeon: Jacki Cones, MD;  Location: [facility];  Service: Orthopedics;  Laterality: Right;    OB History    No  data available       Home Medications    Prior to Admission medications   Medication Sig Start Date End Date Taking? Authorizing Provider  latanoprost (XALATAN) 0.005 % ophthalmic solution Place 1 drop into the right eye every morning.   Yes [provider]  lisinopril (PRINIVIL,ZESTRIL) 40 MG tablet Take 40 mg by mouth daily.   Yes [provider]  timolol (TIMOPTIC-XR) 0.25 % ophthalmic gel-forming Place 1 drop into the right eye every morning.   Yes [provider]  bisacodyl (DULCOLAX) 10 MG suppository Place 1 suppository (10 mg total) rectally daily as needed. Patient not taking: Reported on 03/04/2017 02/14/12   Dimitri Ped, PA-C  ferrous sulfate 325 (65 FE) MG tablet Take 1 tablet (325 mg total) by mouth 3 (three) times daily after meals. Patient not taking: Reported on 03/04/2017 02/14/12   Dimitri Ped, PA-C  HYDROcodone-acetaminophen (NORCO/VICODIN) 5-325 MG per tablet Take 1-2 tablets by mouth every 4 (four) hours as needed (breakthrough pain). Patient not taking: Reported on 03/04/2017 02/14/12   Dimitri Ped, PA-C  HYDROcodone-acetaminophen (NORCO/VICODIN) 5-325 MG tablet Take 1-2 tablets by mouth every 6 (six) hours as needed. Patient not taking: Reported on 03/04/2017 07/15/16   Lorre Nick, MD  methocarbamol (ROBAXIN) 500 MG tablet Take 1 tablet (500 mg total) by mouth every 6 (six) hours as  needed. Patient not taking: Reported on 03/04/2017 02/14/12   Dimitri Pedonstable, Amber, PA-C  warfarin (COUMADIN) 2.5 MG tablet Take 1 tablet (2.5 mg total) by mouth daily. Patient not taking: Reported on 03/04/2017 02/14/12   Dimitri Pedonstable, Amber, PA-C    Family History No family history on file.  Social History Social History   Tobacco Use  . Smoking status: Former Games developermoker  . Smokeless tobacco: Never Used  Substance Use Topics  . Alcohol use: No  . Drug use: No     Allergies   Prednisone   Review of Systems Review of Systems  Neurological:  Positive for dizziness and weakness.  All other systems reviewed and are negative.    Physical Exam Updated Vital Signs BP (!) 220/191   Pulse 63   Temp 97.6 F (36.4 C) (Oral)   Resp 14   Ht 5\' 2"  (1.575 m)   Wt 54.4 kg (120 lb)   SpO2 97%   BMI 21.95 kg/m   Physical Exam  Constitutional: She is oriented to person, place, and time. She appears well-developed and well-nourished. No distress.  HENT:  Head: Normocephalic and atraumatic.  Mouth/Throat: Oropharynx is clear and moist.  Eyes: Conjunctivae and EOM are normal. Pupils are equal, round, and reactive to light.  Neck: Normal range of motion. Neck supple.  Cardiovascular: Normal rate, regular rhythm and normal heart sounds.  Pulmonary/Chest: Effort normal and breath sounds normal. No respiratory distress.  Abdominal: Soft. She exhibits no distension. There is no tenderness.  Musculoskeletal: Normal range of motion. She exhibits no edema or deformity.  Neurological: She is alert and oriented to person, place, and time.  Skin: Skin is warm and dry.  Psychiatric: She has a normal mood and affect.  Nursing note and vitals reviewed.    ED Treatments / Results  Labs (all labs ordered are listed, but only abnormal results are displayed) Labs Reviewed  BASIC METABOLIC PANEL - Abnormal; Notable for the following components:      Result Value   BUN 29 (*)    Creatinine, Ser 1.53 (*)    GFR calc non Af Amer 29 (*)    GFR calc Af Amer 33 (*)    All other components within normal limits  URINALYSIS, ROUTINE W REFLEX MICROSCOPIC - Abnormal; Notable for the following components:   Color, Urine STRAW (*)    Protein, ur 100 (*)    Leukocytes, UA TRACE (*)    Squamous Epithelial / LPF 0-5 (*)    All other components within normal limits  CBC  TROPONIN I  PROTIME-INR  CBG MONITORING, ED    EKG  EKG Interpretation  Date/Time:  Tuesday March 04 2017 09:25:28 EST Ventricular Rate:  67 PR Interval:    QRS  Duration: 89 QT Interval:  404 QTC Calculation: 427 R Axis:   90 Text Interpretation:  Sinus rhythm Consider left atrial enlargement Borderline right axis deviation Confirmed by Kristine RoyalMessick, Peter [phone]([id number]) on 03/04/2017 12:42:37 PM       Radiology Ct Head Wo Contrast  Result Date: 03/04/2017 CLINICAL DATA:  Gait disturbance.  Syncope. EXAM: CT HEAD WITHOUT CONTRAST TECHNIQUE: Contiguous axial images were obtained from the base of the skull through the vertex without intravenous contrast. COMPARISON:  None. FINDINGS: Brain: There is mild diffuse atrophy. There is no intracranial mass, hemorrhage, extra-axial fluid collection, or midline shift. There is patchy small vessel disease in the centra semiovale bilaterally. There is also small vessel disease in each internal capsule and thalamus region. No  well-defined acute infarct evident. Vascular: There is no appreciable hyperdense vessel. There is calcification in each carotid siphon and distal vertebral artery. Skull: Bony calvarium appears intact. Sinuses/Orbits: There is mucosal thickening in multiple ethmoid air cells. Visualized paranasal sinuses elsewhere clear. There are no apparent intraorbital lesions. Other: Mastoid air cells are clear. IMPRESSION: Mild atrophy with patchy supratentorial small vessel disease. No evident acute infarct. No mass or hemorrhage. There are foci of arterial vascular calcification. There is mucosal thickening in multiple ethmoid air cells. Electronically Signed   By: Bretta BangWilliam  Woodruff III M.D.   On: 03/04/2017 10:36   Dg Chest Port 1 View  Result Date: 03/04/2017 CLINICAL DATA:  Shortness of breath, syncope EXAM: PORTABLE CHEST 1 VIEW COMPARISON:  02/10/2012 FINDINGS: Chronic interstitial prominence in the apices and lung bases, likely scarring. Heart is normal size. No confluent airspace opacities or effusions. No acute bony abnormality. IMPRESSION: Chronic changes.  No active disease. Electronically Signed   By: Charlett NoseKevin   Dover M.D.   On: 03/04/2017 09:58    Procedures Procedures (including critical care time)  Medications Ordered in ED Medications  0.9 %  sodium chloride infusion (not administered)  hydrALAZINE (APRESOLINE) injection 20 mg (not administered)  sodium chloride flush (NS) 0.9 % injection 3 mL (3 mLs Intravenous Not Given 03/04/17 1342)  enoxaparin (LOVENOX) injection 40 mg (not administered)  ALPRAZolam (XANAX) tablet 0.25 mg (not administered)     Initial Impression / Assessment and Plan / ED Course  I have reviewed the triage vital signs and the nursing notes.  Pertinent labs & imaging results that were available during my care of the patient were reviewed by me and considered in my medical decision making (see chart for details).     MSE complete   Patient with near syncope.  Screening evaluations in the ED do not reveal significant acute pathology.  However, given her age, will admit for further workup and evaluation by the hospitalist medicine team.    Final Clinical Impressions(s) / ED Diagnoses   Final diagnoses:  Near syncope    ED Discharge Orders    None       Wynetta FinesMessick, Peter C, MD 03/04/17 1352

## 2017-03-04 NOTE — ED Notes (Signed)
Patient transported to Ultrasound 

## 2017-03-04 NOTE — ED Notes (Signed)
Got patient undress on the monitor did ekg shown to the er doctor patient is resting with call bell in reach 

## 2017-03-04 NOTE — ED Triage Notes (Signed)
Pt arrives EMS from home with self described."I feel like I'm going to die." c/o difficulty walking, weakness, feels need for bowel movement. A&ox 3 maew.Denies pain.

## 2017-03-04 NOTE — ED Notes (Signed)
Pt eating lunch and tolerating.

## 2017-03-04 NOTE — H&P (Signed)
History and Physical    Yvonne CasinoBetty J Beck ZOX:[id number] DOB: 1923/10/02 DOA: 03/04/2017   PCP: System, Pcp Not In   Attending physician: Mariea ClontsEmokpae  Patient coming from/Resides with: Private residence/family  Chief Complaint: Dizziness, near syncope  HPI: Yvonne Beck is a 83 y.o. female with medical history significant for hypertension, glaucoma, arthritis and history of right hip total arthroplasty in 2013.  Patient reports that upon awakening this morning she noticed she was dizzy primarily with standing.  Later by walking to the bathroom she had a sensation that she was spinning.  The sensation eventually resolved.  The sensation did not occur while she was supine.  She had at least 2 more these episodes at home prompting her to seek medical attention.  Initial blood pressure was 198/72.  Patient was also very anxious at presentation and informed the triage nurse "I feel like I am going to die".  She further clarified to me that she is always anxious but it was worse today.  She has not had any new medications added, she has not had any dosage changes on her current medications, she does not take any diuretics, she has not had any GI illnesses such as nausea vomiting or diarrhea.  He was noted to have elevated BUN and slight increase of creatinine consistent with a mild acute kidney injury noting last baseline renal function available was in 2013 for that hospitalization.  CT of the head and chest x-ray unremarkable.  ED Course:  Vital Signs: BP (!) 220/191   Pulse 63   Temp 97.6 F (36.4 C) (Oral)   Resp 14   Ht 5\' 2"  (1.575 m)   Wt 54.4 kg (120 lb)   SpO2 97%   BMI 21.95 kg/m  PCXR: As above CT head without contrast: As above Lab data: Sodium 138, potassium 4.7, chloride 104, CO2 24, glucose 98, BUN 29, creatinine 1.53, anion gap 10, troponin <0.03, white count 5700 differential not obtained, hemoglobin 13.2, platelets 222,000, urinalysis mildly abnormal with straw-colored  appearance, trace leukocytes, 100 protein. Medications and treatments: Home dose lisinopril ordered but discontinued in context of acute kidney injury  Review of Systems:  In addition to the HPI above,  No Fever-chills, myalgias or other constitutional symptoms No Headache, changes with Vision or hearing, new weakness, tingling, numbness in any extremity, dysarthria or word finding difficulty, gait disturbance or imbalance, tremors or seizure activity No problems swallowing food or Liquids, indigestion/reflux, choking or coughing while eating, abdominal pain with or after eating No Chest pain, Cough or Shortness of Breath, palpitations, orthopnea or DOE No Abdominal pain, N/V, melena,hematochezia, dark tarry stools, constipation No dysuria, malodorous urine, hematuria or flank pain No new skin rashes, lesions, masses or bruises, No new joint pains, aches, swelling or redness No recent unintentional weight gain or loss No polyuria, polydypsia or polyphagia   Past Medical History:  Diagnosis Date  . Arthritis   . Hypertension     Past Surgical History:  Procedure Laterality Date  . NO PAST SURGERIES    . TOTAL HIP ARTHROPLASTY  02/10/2012   Procedure: TOTAL HIP ARTHROPLASTY;  Surgeon: Jacki Conesonald A Gioffre, MD;  Location: [facility];  Service: Orthopedics;  Laterality: Right;    Social History   Socioeconomic History  . Marital status: Widowed    Spouse name: Not on file  . Number of children: Not on file  . Years of education: Not on file  . Highest education level: Not on file  Social Needs  .  Financial resource strain: Not on file  . Food insecurity - worry: Not on file  . Food insecurity - inability: Not on file  . Transportation needs - medical: Not on file  . Transportation needs - non-medical: Not on file  Occupational History  . Not on file  Tobacco Use  . Smoking status: Former Games developer  . Smokeless tobacco: Never Used  Substance and Sexual Activity  . Alcohol use: No    . Drug use: No  . Sexual activity: Not on file  Other Topics Concern  . Not on file  Social History Narrative  . Not on file    Mobility: Rolling walker Work history: Not obtained   Allergies  Allergen Reactions  . Prednisone Other (See Comments)    Unspecified     Family history reviewed with patient and family members and not pertinent current admission findings or diagnosis  Prior to Admission medications   Medication Sig Start Date End Date Taking? Authorizing Provider  latanoprost (XALATAN) 0.005 % ophthalmic solution Place 1 drop into the right eye every morning.   Yes [provider]  lisinopril (PRINIVIL,ZESTRIL) 40 MG tablet Take 40 mg by mouth daily.   Yes [provider]  timolol (TIMOPTIC-XR) 0.25 % ophthalmic gel-forming Place 1 drop into the right eye every morning.   Yes [provider]  bisacodyl (DULCOLAX) 10 MG suppository Place 1 suppository (10 mg total) rectally daily as needed. Patient not taking: Reported on 03/04/2017 02/14/12   Dimitri Ped, PA-C  ferrous sulfate 325 (65 FE) MG tablet Take 1 tablet (325 mg total) by mouth 3 (three) times daily after meals. Patient not taking: Reported on 03/04/2017 02/14/12   Dimitri Ped, PA-C  HYDROcodone-acetaminophen (NORCO/VICODIN) 5-325 MG per tablet Take 1-2 tablets by mouth every 4 (four) hours as needed (breakthrough pain). Patient not taking: Reported on 03/04/2017 02/14/12   Dimitri Ped, PA-C  HYDROcodone-acetaminophen (NORCO/VICODIN) 5-325 MG tablet Take 1-2 tablets by mouth every 6 (six) hours as needed. Patient not taking: Reported on 03/04/2017 07/15/16   Lorre Nick, MD  methocarbamol (ROBAXIN) 500 MG tablet Take 1 tablet (500 mg total) by mouth every 6 (six) hours as needed. Patient not taking: Reported on 03/04/2017 02/14/12   Dimitri Ped, PA-C  warfarin (COUMADIN) 2.5 MG tablet Take 1 tablet (2.5 mg total) by mouth daily. Patient not taking: Reported on  03/04/2017 02/14/12   Dimitri Ped, PA-C    Physical Exam: Vitals:   03/04/17 1215 03/04/17 1230 03/04/17 1245 03/04/17 1300  BP: (!) 189/82 (!) 157/92 (!) 169/71 (!) 220/191  Pulse: 62 60 60 63  Resp: 18 17 13 14   Temp:      TempSrc:      SpO2: 98% 97% 97% 97%  Weight:      Height:          Constitutional: NAD, mildly anxious but appropriate, comfortable Eyes: PERRL, lids and conjunctivae normal-PERI Orbital redness bilaterally consistent with dry skin ENMT: Mucous membranes are moist. Posterior pharynx clear of any exudate or lesions.Normal dentition.  Neck: normal, supple, no masses, no thyromegaly Respiratory: clear to auscultation bilaterally, no wheezing, no crackles. Normal respiratory effort. No accessory muscle use.  Cardiovascular: Regular rate and rhythm, no murmurs / rubs / gallops. No extremity edema. 2+ pedal pulses. No carotid bruits.  Abdomen: no tenderness, no masses palpated. No hepatosplenomegaly. Bowel sounds positive.  Musculoskeletal: no clubbing / cyanosis. No joint deformity upper and lower extremities. Good ROM, no contractures. Normal muscle tone.  Skin:  no rashes, lesions, ulcers. No induration Neurologic: CN 2-12 grossly intact.  No nystagmus appreciated on exam.  Sensation intact, DTR normal. Strength 5/5 x all 4 extremities.  Psychiatric: Normal judgment and insight. Alert and oriented x 3. Normal mood.    Labs on Admission: I have personally reviewed following labs and imaging studies  CBC: Recent Labs  Lab 03/04/17 1010  WBC 5.7  HGB 13.2  HCT 40.3  MCV 96.9  PLT 222   Basic Metabolic Panel: Recent Labs  Lab 03/04/17 1010  NA 138  K 4.7  CL 104  CO2 24  GLUCOSE 98  BUN 29*  CREATININE 1.53*  CALCIUM 9.4   GFR: Estimated Creatinine Clearance: 18.9 mL/min (A) (by C-G formula based on SCr of 1.53 mg/dL (H)). Liver Function Tests: No results for input(s): AST, ALT, ALKPHOS, BILITOT, PROT, ALBUMIN in the last 168 hours. No  results for input(s): LIPASE, AMYLASE in the last 168 hours. No results for input(s): AMMONIA in the last 168 hours. Coagulation Profile: No results for input(s): INR, PROTIME in the last 168 hours. Cardiac Enzymes: Recent Labs  Lab 03/04/17 1010  TROPONINI <0.03   BNP (last 3 results) No results for input(s): PROBNP in the last 8760 hours. HbA1C: No results for input(s): HGBA1C in the last 72 hours. CBG: Recent Labs  Lab 03/04/17 1015  GLUCAP 95   Lipid Profile: No results for input(s): CHOL, HDL, LDLCALC, TRIG, CHOLHDL, LDLDIRECT in the last 72 hours. Thyroid Function Tests: No results for input(s): TSH, T4TOTAL, FREET4, T3FREE, THYROIDAB in the last 72 hours. Anemia Panel: No results for input(s): VITAMINB12, FOLATE, FERRITIN, TIBC, IRON, RETICCTPCT in the last 72 hours. Urine analysis:    Component Value Date/Time   COLORURINE STRAW (A) 03/04/2017 1119   APPEARANCEUR CLEAR 03/04/2017 1119   LABSPEC 1.006 03/04/2017 1119   PHURINE 7.0 03/04/2017 1119   GLUCOSEU NEGATIVE 03/04/2017 1119   HGBUR NEGATIVE 03/04/2017 1119   BILIRUBINUR NEGATIVE 03/04/2017 1119   KETONESUR NEGATIVE 03/04/2017 1119   PROTEINUR 100 (A) 03/04/2017 1119   UROBILINOGEN 0.2 02/10/2012 1226   NITRITE NEGATIVE 03/04/2017 1119   LEUKOCYTESUR TRACE (A) 03/04/2017 1119   Sepsis Labs: @LABRCNTIP (procalcitonin:4,lacticidven:4) )No results found for this or any previous visit (from the past 240 hour(s)).   Radiological Exams on Admission: Ct Head Wo Contrast  Result Date: 03/04/2017 CLINICAL DATA:  Gait disturbance.  Syncope. EXAM: CT HEAD WITHOUT CONTRAST TECHNIQUE: Contiguous axial images were obtained from the base of the skull through the vertex without intravenous contrast. COMPARISON:  None. FINDINGS: Brain: There is mild diffuse atrophy. There is no intracranial mass, hemorrhage, extra-axial fluid collection, or midline shift. There is patchy small vessel disease in the centra semiovale  bilaterally. There is also small vessel disease in each internal capsule and thalamus region. No well-defined acute infarct evident. Vascular: There is no appreciable hyperdense vessel. There is calcification in each carotid siphon and distal vertebral artery. Skull: Bony calvarium appears intact. Sinuses/Orbits: There is mucosal thickening in multiple ethmoid air cells. Visualized paranasal sinuses elsewhere clear. There are no apparent intraorbital lesions. Other: Mastoid air cells are clear. IMPRESSION: Mild atrophy with patchy supratentorial small vessel disease. No evident acute infarct. No mass or hemorrhage. There are foci of arterial vascular calcification. There is mucosal thickening in multiple ethmoid air cells. Electronically Signed   By: Bretta Bang III M.D.   On: 03/04/2017 10:36   Dg Chest Port 1 View  Result Date: 03/04/2017 CLINICAL DATA:  Shortness of breath,  syncope EXAM: PORTABLE CHEST 1 VIEW COMPARISON:  02/10/2012 FINDINGS: Chronic interstitial prominence in the apices and lung bases, likely scarring. Heart is normal size. No confluent airspace opacities or effusions. No acute bony abnormality. IMPRESSION: Chronic changes.  No active disease. Electronically Signed   By: Charlett NoseKevin  Dover M.D.   On: 03/04/2017 09:58    EKG: (Independently reviewed) sinus rhythm ventricular rate 67 bpm, QTC 427 ms, normal R wave rotation, early repolarization in inferior lateral leads, no acute ischemic changes  Assessment/Plan Principal Problem:   Acute kidney injury  -Presents with dizziness and near syncopal symptoms and was found to have elevated BUN and creatinine and uncontrolled blood pressure -Baseline renal function 2013: 14 and 1.31 -Current renal function: 29 and 1.53 -Based on history no definitive explanation for abnormal renal function -Given elevated BUN consider unexplained volume depletion-of IV fluids and follow labs -Hemoglobin stable but with elevated BUN monitor for occult  GI bleeding -Renal ultrasound -Hold ACE inhibitor -No electrolyte panels available since 2013 and abnormal renal function could be progression and chronic kidney disease therefore patient may no longer be a candidate for utilization of ARB/ACE inhibitor for blood pressure management  Active Problems:   Near syncope -No focal neurological deficits and no nystagmus reproduced on exam although does sound like she is having intermittent episodes of vertigo -If vertigo returns will need PT BPV evaluation -Mobilize with assistance only -Orthostatic vital signs -Control blood pressure -Echocardiogram    Acute anxiety -Likely contributing factor to some of patient's symptoms -She reports that she is anxious "all the time" -Short-term Xanax during acute stay but likely would be a candidate for BuSpar after discharge    Uncontrolled hypertension -Patient reports takes medication appropriately -?  Due to renal impairment new intolerance to ACE inhibitor -Holding ACE inhibitor -Hydralazine IV prn -Likely needs to be changed to new antihypertensive agent prior to discharge      DVT prophylaxis: Lovenox Code Status: Full Family Communication: Family at bedside Disposition Plan: Home Consults called: None    Athanasius Kesling L. ANP-BC [facility] Pager [phone]   If 7PM-7AM, please contact night-coverage [email or web address] Password [email or web address]  03/04/2017, 1:47 PM

## 2017-03-05 ENCOUNTER — Observation Stay (HOSPITAL_BASED_OUTPATIENT_CLINIC_OR_DEPARTMENT_OTHER): Payer: PPO

## 2017-03-05 ENCOUNTER — Observation Stay (HOSPITAL_COMMUNITY): Payer: PPO

## 2017-03-05 ENCOUNTER — Encounter (HOSPITAL_COMMUNITY): Payer: Self-pay | Admitting: General Practice

## 2017-03-05 DIAGNOSIS — E860 Dehydration: Secondary | ICD-10-CM | POA: Diagnosis present

## 2017-03-05 DIAGNOSIS — I129 Hypertensive chronic kidney disease with stage 1 through stage 4 chronic kidney disease, or unspecified chronic kidney disease: Secondary | ICD-10-CM | POA: Diagnosis present

## 2017-03-05 DIAGNOSIS — Z888 Allergy status to other drugs, medicaments and biological substances status: Secondary | ICD-10-CM | POA: Diagnosis not present

## 2017-03-05 DIAGNOSIS — Z87891 Personal history of nicotine dependence: Secondary | ICD-10-CM | POA: Diagnosis not present

## 2017-03-05 DIAGNOSIS — N183 Chronic kidney disease, stage 3 (moderate): Secondary | ICD-10-CM | POA: Diagnosis present

## 2017-03-05 DIAGNOSIS — N179 Acute kidney failure, unspecified: Secondary | ICD-10-CM | POA: Diagnosis present

## 2017-03-05 DIAGNOSIS — H409 Unspecified glaucoma: Secondary | ICD-10-CM | POA: Diagnosis present

## 2017-03-05 DIAGNOSIS — F419 Anxiety disorder, unspecified: Secondary | ICD-10-CM | POA: Diagnosis present

## 2017-03-05 DIAGNOSIS — R55 Syncope and collapse: Secondary | ICD-10-CM

## 2017-03-05 DIAGNOSIS — M1990 Unspecified osteoarthritis, unspecified site: Secondary | ICD-10-CM | POA: Diagnosis present

## 2017-03-05 DIAGNOSIS — Z96641 Presence of right artificial hip joint: Secondary | ICD-10-CM | POA: Diagnosis present

## 2017-03-05 DIAGNOSIS — I351 Nonrheumatic aortic (valve) insufficiency: Secondary | ICD-10-CM | POA: Diagnosis not present

## 2017-03-05 LAB — COMPREHENSIVE METABOLIC PANEL
ALT: 7 U/L — AB (ref 14–54)
ANION GAP: 10 (ref 5–15)
AST: 15 U/L (ref 15–41)
Albumin: 3.3 g/dL — ABNORMAL LOW (ref 3.5–5.0)
Alkaline Phosphatase: 62 U/L (ref 38–126)
BUN: 24 mg/dL — ABNORMAL HIGH (ref 6–20)
CHLORIDE: 110 mmol/L (ref 101–111)
CO2: 20 mmol/L — AB (ref 22–32)
CREATININE: 1.47 mg/dL — AB (ref 0.44–1.00)
Calcium: 8.6 mg/dL — ABNORMAL LOW (ref 8.9–10.3)
GFR, EST AFRICAN AMERICAN: 35 mL/min — AB (ref >60)
GFR, EST NON AFRICAN AMERICAN: 30 mL/min — AB (ref >60)
Glucose, Bld: 100 mg/dL — ABNORMAL HIGH (ref 65–99)
POTASSIUM: 4.3 mmol/L (ref 3.5–5.1)
SODIUM: 140 mmol/L (ref 135–145)
Total Bilirubin: 0.6 mg/dL (ref 0.3–1.2)
Total Protein: 5.9 g/dL — ABNORMAL LOW (ref 6.5–8.1)

## 2017-03-05 LAB — TROPONIN I
Troponin I: 0.06 ng/mL (ref <0.03)
Troponin I: 0.06 ng/mL (ref <0.03)

## 2017-03-05 LAB — CBC
HEMATOCRIT: 35.7 % — AB (ref 36.0–46.0)
HEMOGLOBIN: 11.4 g/dL — AB (ref 12.0–15.0)
MCH: 30.8 pg (ref 26.0–34.0)
MCHC: 31.9 g/dL (ref 30.0–36.0)
MCV: 96.5 fL (ref 78.0–100.0)
PLATELETS: 195 10*3/uL (ref 150–400)
RBC: 3.7 MIL/uL — AB (ref 3.87–5.11)
RDW: 14.6 % (ref 11.5–15.5)
WBC: 7.6 10*3/uL (ref 4.0–10.5)

## 2017-03-05 LAB — GLUCOSE, CAPILLARY: Glucose-Capillary: 85 mg/dL (ref 65–99)

## 2017-03-05 MED ORDER — INFLUENZA VAC SPLIT HIGH-DOSE 0.5 ML IM SUSY
0.5000 mL | PREFILLED_SYRINGE | INTRAMUSCULAR | Status: DC
  Filled 2017-03-05: qty 0.5

## 2017-03-05 MED ORDER — METOPROLOL TARTRATE 12.5 MG HALF TABLET
12.5000 mg | ORAL_TABLET | Freq: Two times a day (BID) | ORAL | Status: DC
  Administered 2017-03-05 – 2017-03-07 (×5): 12.5 mg via ORAL
  Filled 2017-03-05 (×5): qty 1

## 2017-03-05 MED ORDER — ACETAMINOPHEN 325 MG PO TABS
650.0000 mg | ORAL_TABLET | Freq: Four times a day (QID) | ORAL | Status: DC | PRN
  Filled 2017-03-05: qty 2

## 2017-03-05 MED ORDER — HYDROCODONE-ACETAMINOPHEN 5-325 MG PO TABS: 1.0000 | ORAL_TABLET | ORAL | Status: DC | PRN

## 2017-03-05 MED ORDER — LATANOPROST 0.005 % OP SOLN
1.0000 [drp] | Freq: Every morning | OPHTHALMIC | Status: DC
  Administered 2017-03-05 – 2017-03-07 (×3): 1 [drp] via OPHTHALMIC
  Filled 2017-03-05: qty 2.5

## 2017-03-05 MED ORDER — TIMOLOL MALEATE 0.25 % OP SOLG
1.0000 [drp] | Freq: Every morning | OPHTHALMIC | Status: DC
  Administered 2017-03-05 – 2017-03-07 (×3): 1 [drp] via OPHTHALMIC
  Filled 2017-03-05: qty 5

## 2017-03-05 MED ORDER — HYDROCODONE-ACETAMINOPHEN 5-325 MG PO TABS
1.0000 | ORAL_TABLET | ORAL | Status: DC | PRN
  Administered 2017-03-05: 1 via ORAL
  Filled 2017-03-05: qty 1

## 2017-03-05 MED ORDER — FERROUS SULFATE 325 (65 FE) MG PO TABS
325.0000 mg | ORAL_TABLET | Freq: Three times a day (TID) | ORAL | Status: DC
  Administered 2017-03-05 – 2017-03-07 (×6): 325 mg via ORAL
  Filled 2017-03-05 (×6): qty 1

## 2017-03-05 NOTE — Progress Notes (Signed)
PROGRESS NOTE    Yvonne Beck  ZOX:[id number] DOB: 1923-10-17 DOA: 03/04/2017 PCP: System, Pcp Not In  Brief Narrative: 83 y.o. female with medical history significant for hypertension, glaucoma, arthritis and history of right hip total arthroplasty in 2013.  Patient reports that upon awakening this morning she noticed she was dizzy primarily with standing.  Later by walking to the bathroom she had a sensation that she was spinning.  The sensation eventually resolved.  The sensation did not occur while she was supine.  She had at least 2 more these episodes at home prompting her to seek medical attention.  Initial blood pressure was 198/72.  Patient was also very anxious at presentation and informed the triage nurse "I feel like I am going to die".  She further clarified to me that she is always anxious but it was worse today.  She has not had any new medications added, she has not had any dosage changes on her current medications, she does not take any diuretics, she has not had any GI illnesses such as nausea vomiting or diarrhea.  He was noted to have elevated BUN and slight increase of creatinine consistent with a mild acute kidney injury noting last baseline renal function available was in 2013 for that hospitalization.  CT of the head and chest x-ray unremarkable    Assessment & Plan:   Principal Problem:   Acute kidney injury (HCC) Active Problems:   Near syncope   Acute anxiety   Uncontrolled hypertension  1] near syncope patient was treated with IV hydration secondary to possible dehydration and elevated renal functions.  Patient walks with a walker at home.  She reported that she walked to the hospital with a walker without feeling dizzy.  Will get PT evaluation.  Echocardiogram has been ordered and has not been done yet.  I will continue to hold her narcotics and baclofen which she takes at home which probably have contributed to her dizziness. 2] acute kidney injury versus acute on  chronic CKD-continue IV hydration and recheck renal functions tomorrow.  Renal functions have improved since admission.  Renal ultrasound showedBilateral renal atrophy is noted. Increased echogenicity of renal parenchyma is noted suggesting medical renal disease.  ----ace inhibitors is currently on hold. 3] uncontrolled hypertension at the time of admission-she is on IV hydralazine as needed at this time I will started on metoprolol and adjust the dose as needed. 4] anxiety takes Xanax at home. 5] glaucoma restart eyedrops Xalatan and timolol. 6] elevated troponin 0 0.06 will recheck one more level to see the trend.  It is probably secondary to the renal insufficiency that the troponin is elevated.  Patient denies any chest pain at this time.   DVT prophylaxis: Lovenox  Code Status: Full code Family Communication no family available Disposition Plan: TBD Consultants:  None  Procedures: None Antimicrobials: None  Subjective: Feels well denies any dizziness nausea vomiting or diarrhea.  However she is requesting her eyedrops to be restarted.   Objective: Sitting up in bed in no acute distress answered all my questions appropriately denies any chest pain headaches dizziness at this time. Vitals:   03/04/17 2221 03/05/17 0223 03/05/17 0331 03/05/17 0551  BP: (!) 131/53 (!) 173/85 (!) 142/63 (!) 170/66  Pulse: 81 91 74 73  Resp: 20 16  18   Temp: 99.5 F (37.5 C) 100.2 F (37.9 C)  97.9 F (36.6 C)  TempSrc: Oral Oral  Oral  SpO2: 96% 97%  97%  Weight:  55.2 kg (121 lb 11.1 oz)  Height:        Intake/Output Summary (Last 24 hours) at 03/05/2017 1030 Last data filed at 03/05/2017 0300 Gross per 24 hour  Intake 1224.16 ml  Output 1 ml  Net 1223.16 ml   Filed Weights   03/04/17 0926 03/04/17 1721 03/05/17 0551  Weight: 54.4 kg (120 lb) 55.2 kg (121 lb 11.1 oz) 55.2 kg (121 lb 11.1 oz)    Examination:  General exam: Appears calm and comfortable  Respiratory system: Clear  to auscultation. Respiratory effort normal. Cardiovascular system: S1 & S2 heard, RRR. No JVD, murmurs, rubs, gallops or clicks. No pedal edema. Gastrointestinal system: Abdomen is nondistended, soft and nontender. No organomegaly or masses felt. Normal bowel sounds heard. Central nervous system: Alert and oriented. No focal neurological deficits. Extremities: Symmetric 5 x 5 power. Skin: No rashes, lesions or ulcers Psychiatry: Judgement and insight appear normal. Mood & affect appropriate.     Data Reviewed: I have personally reviewed following labs and imaging studies  CBC: Recent Labs  Lab 03/04/17 1010 03/05/17 0223  WBC 5.7 7.6  HGB 13.2 11.4*  HCT 40.3 35.7*  MCV 96.9 96.5  PLT 222 195   Basic Metabolic Panel: Recent Labs  Lab 03/04/17 1010 03/05/17 0223  NA 138 140  K 4.7 4.3  CL 104 110  CO2 24 20*  GLUCOSE 98 100*  BUN 29* 24*  CREATININE 1.53* 1.47*  CALCIUM 9.4 8.6*   GFR: Estimated Creatinine Clearance: 19.4 mL/min (A) (by C-G formula based on SCr of 1.47 mg/dL (H)). Liver Function Tests: Recent Labs  Lab 03/05/17 0223  AST 15  ALT 7*  ALKPHOS 62  BILITOT 0.6  PROT 5.9*  ALBUMIN 3.3*   No results for input(s): LIPASE, AMYLASE in the last 168 hours. No results for input(s): AMMONIA in the last 168 hours. Coagulation Profile: Recent Labs  Lab 03/04/17 1015  INR 1.00   Cardiac Enzymes: Recent Labs  Lab 03/04/17 1010 03/04/17 2106 03/05/17 0223  TROPONINI <0.03 <0.03 0.06*   BNP (last 3 results) No results for input(s): PROBNP in the last 8760 hours. HbA1C: No results for input(s): HGBA1C in the last 72 hours. CBG: Recent Labs  Lab 03/04/17 1015 03/05/17 0550  GLUCAP 95 85   Lipid Profile: No results for input(s): CHOL, HDL, LDLCALC, TRIG, CHOLHDL, LDLDIRECT in the last 72 hours. Thyroid Function Tests: No results for input(s): TSH, T4TOTAL, FREET4, T3FREE, THYROIDAB in the last 72 hours. Anemia Panel: No results for input(s):  VITAMINB12, FOLATE, FERRITIN, TIBC, IRON, RETICCTPCT in the last 72 hours. Sepsis Labs: No results for input(s): PROCALCITON, LATICACIDVEN in the last 168 hours.  No results found for this or any previous visit (from the past 240 hour(s)).       Radiology Studies: Ct Head Wo Contrast  Result Date: 03/04/2017 CLINICAL DATA:  Gait disturbance.  Syncope. EXAM: CT HEAD WITHOUT CONTRAST TECHNIQUE: Contiguous axial images were obtained from the base of the skull through the vertex without intravenous contrast. COMPARISON:  None. FINDINGS: Brain: There is mild diffuse atrophy. There is no intracranial mass, hemorrhage, extra-axial fluid collection, or midline shift. There is patchy small vessel disease in the centra semiovale bilaterally. There is also small vessel disease in each internal capsule and thalamus region. No well-defined acute infarct evident. Vascular: There is no appreciable hyperdense vessel. There is calcification in each carotid siphon and distal vertebral artery. Skull: Bony calvarium appears intact. Sinuses/Orbits: There is mucosal thickening in multiple  ethmoid air cells. Visualized paranasal sinuses elsewhere clear. There are no apparent intraorbital lesions. Other: Mastoid air cells are clear. IMPRESSION: Mild atrophy with patchy supratentorial small vessel disease. No evident acute infarct. No mass or hemorrhage. There are foci of arterial vascular calcification. There is mucosal thickening in multiple ethmoid air cells. Electronically Signed   By: Bretta BangWilliam  Woodruff III M.D.   On: 03/04/2017 10:36   [place] Renal  Result Date: 03/04/2017 CLINICAL DATA:  Acute kidney injury. EXAM: RENAL / URINARY TRACT ULTRASOUND COMPLETE COMPARISON:  Ultrasound of June 09, 2009. FINDINGS: Right Kidney: Length: 8.6 cm. Increased echogenicity of renal parenchyma is noted suggesting medical renal disease. 7 mm cyst is noted. No mass or hydronephrosis visualized. Left Kidney: Length: 8.1 cm. Increased  echogenicity of renal parenchyma is noted suggesting medical renal disease. 2 cysts are noted, with the largest measuring 8 mm. No mass or hydronephrosis visualized. Bladder: Appears normal for degree of bladder distention. IMPRESSION: Bilateral renal atrophy is noted. Increased echogenicity of renal parenchyma is noted suggesting medical renal disease. Electronically Signed   By: Lupita RaiderJames  Green Jr, M.D.   On: 03/04/2017 14:56   Dg Chest Port 1 View  Result Date: 03/04/2017 CLINICAL DATA:  Shortness of breath, syncope EXAM: PORTABLE CHEST 1 VIEW COMPARISON:  02/10/2012 FINDINGS: Chronic interstitial prominence in the apices and lung bases, likely scarring. Heart is normal size. No confluent airspace opacities or effusions. No acute bony abnormality. IMPRESSION: Chronic changes.  No active disease. Electronically Signed   By: Charlett NoseKevin  Dover M.D.   On: 03/04/2017 09:58        Scheduled Meds: . enoxaparin (LOVENOX) injection  30 mg Subcutaneous Q24H  . ferrous sulfate  325 mg Oral TID PC  . latanoprost  1 drop Right Eye q morning - 10a  . sodium chloride flush  3 mL Intravenous Q12H  . timolol  1 drop Right Eye q morning - 10a   Continuous Infusions: . sodium chloride 125 mL/hr at 03/04/17 2126     LOS: 0 days       Alwyn RenElizabeth G , MD [facility]  If 7PM-7AM, please contact night-coverage [email or web address] Password [email or web address] 03/05/2017, 10:30 AM

## 2017-03-05 NOTE — Progress Notes (Signed)
Pt requesting eyes drops xalantan and timolol. No order. MD notified

## 2017-03-05 NOTE — Progress Notes (Signed)
CRITICAL VALUE ALERT  Critical Value: troponin 0.06   Date & Time Notified: 03/05/2017  3:54 AM  Provider Notified: Antionette Charpyd, MD  Orders Received/Actions taken: No new orders at this time. Will continue to monitor.

## 2017-03-05 NOTE — Plan of Care (Signed)
  Progressing Activity: Risk for activity intolerance will decrease 03/05/2017 1122 - Progressing by Angela Nevinharleston, Erland Vivas, RN Nutrition: Adequate nutrition will be maintained 03/05/2017 1122 - Progressing by Angela Nevinharleston, Adrena Nakamura, RN Coping: Level of anxiety will decrease 03/05/2017 1122 - Progressing by Angela Nevinharleston, Javonne Louissaint, RN

## 2017-03-05 NOTE — Progress Notes (Signed)
*  PRELIMINARY RESULTS* Vascular Ultrasound Carotid Duplex (Doppler) has been completed.  Preliminary findings: Bilateral 1-39% ICA stenosis, antegrade vertebral flow.   Chauncey FischerCharlotte C Topanga Alvelo 03/05/2017, 9:28 AM

## 2017-03-05 NOTE — Progress Notes (Signed)
CNA informed nurse that Pt and family member were arguing.  Went to room to find granddaughter yelling at Pt, stating "tell me what I can do to help you, I don't know what you want me to do to take care of you. I will do anything to help you."  Nurse talked again with granddaughter (Tammy) and Pt.  Granddaughter feels that xanax has caused increased confusion and yells at nurse stating "she better not ever get those drugs again, we don't take drugs in our family.  She never even takes tylenol!"  Nurse reassured family that we would talk to MD about discontinuing this medication.  Pt assisted to lie back in bed and lights turned off. Granddaughter calm and watching TV at bedside.

## 2017-03-05 NOTE — Evaluation (Addendum)
Physical Therapy Evaluation Patient Details Name: Yvonne Beck MRN: [id number] DOB: Oct 03, 1923 Today's Date: 03/05/2017   History of Present Illness  Pt is a 83 y/o female admitted secondary to having a near syncopal episode, and found to have an AKI. PMH including but not limited arthritis, HTN and R THA in 2013.  Clinical Impression  Pt presented supine in bed with HOB elevated, awake and willing to participate in therapy session. Prior to admission, pt reported that she ambulates with use of RW at all times and is independent with ADLs. Pt reported that she lives with her son and granddaughter in a single level home. However, pt very confused throughout with cognitive deficits listed below. Pt ambulated within her room (to and from the bathroom) with use of RW and min A for stability and safety. Pt is at a high risk for falls as she has admittedly fallen several times in the past six months and is very unsteady with functional mobility with use of RW. Pt would continue to benefit from skilled physical therapy services at this time while admitted and after d/c to address the below listed limitations in order to improve overall safety and independence with functional mobility.     Follow Up Recommendations SNF;Supervision/Assistance - 24 hour;Other (comment)(if pt/family refuses, pt will need HHPT and 24/7 supervision)    Equipment Recommendations  None recommended by PT    Recommendations for Other Services       Precautions / Restrictions Precautions Precautions: Fall Precaution Comments: pt admitted to several falls over the past 6 months Restrictions Weight Bearing Restrictions: No      Mobility  Bed Mobility               General bed mobility comments: pt standing in room alone upon arrival  Transfers Overall transfer level: Needs assistance Equipment used: Rolling walker (2 wheeled) Transfers: Sit to/from Stand Sit to Stand: Min guard         General transfer  comment: for safety, x2 from toilet and x1 from recliner chair  Ambulation/Gait Ambulation/Gait assistance: Min guard Ambulation Distance (Feet): 20 Feet(20' x2 with sitting rest break to void) Assistive device: Rolling walker (2 wheeled) Gait Pattern/deviations: Step-through pattern;Decreased step length - right;Decreased step length - left;Decreased stride length;Shuffle Gait velocity: decreased Gait velocity interpretation: Below normal speed for age/gender General Gait Details: modest instability requiring constant min A for safety and balance with use of RW  Stairs            Wheelchair Mobility    Modified Rankin (Stroke Patients Only)       Balance Overall balance assessment: Needs assistance;History of Falls Sitting-balance support: Feet supported Sitting balance-Leahy Scale: Good     Standing balance support: During functional activity;No upper extremity supported Standing balance-Leahy Scale: Fair Standing balance comment: static standing is fair, min guard for peri care                             Pertinent Vitals/Pain Pain Assessment: No/denies pain    Home Living Family/patient expects to be discharged to:: Private residence Living Arrangements: Children;Other relatives Available Help at Discharge: Family Type of Home: House Home Access: Stairs to enter Entrance Stairs-Rails: Doctor, general practiceight;Left Entrance Stairs-Number of Steps: a few Home Layout: One level Home Equipment: Walker - 2 wheels;Shower seat      Prior Function Level of Independence: Independent with assistive device(s)         Comments:  pt reported that she ambulates with use of RW at all time. She stated that she is independent with ADLs but has her granddaughter present for safety     Hand Dominance        Extremity/Trunk Assessment   Upper Extremity Assessment Upper Extremity Assessment: Generalized weakness    Lower Extremity Assessment Lower Extremity Assessment:  Generalized weakness       Communication   Communication: No difficulties  Cognition Arousal/Alertness: Awake/alert Behavior During Therapy: WFL for tasks assessed/performed Overall Cognitive Status: Impaired/Different from baseline Area of Impairment: Orientation;Attention;Memory;Following commands;Safety/judgement;Problem solving                 Orientation Level: Disoriented to;Place;Situation Current Attention Level: Sustained Memory: Decreased short-term memory Following Commands: Follows one step commands with increased time Safety/Judgement: Decreased awareness of safety;Decreased awareness of deficits   Problem Solving: Difficulty sequencing;Requires verbal cues;Requires tactile cues General Comments: pt's granddaughter reporting pt has issues with memory at baseline      General Comments      Exercises     Assessment/Plan    PT Assessment Patient needs continued PT services  PT Problem List Decreased strength;Decreased balance;Decreased mobility;Decreased coordination;Decreased cognition;Decreased knowledge of use of DME;Decreased safety awareness       PT Treatment Interventions DME instruction;Gait training;Stair training;Functional mobility training;Therapeutic activities;Therapeutic exercise;Neuromuscular re-education;Balance training;Cognitive remediation;Patient/family education    PT Goals (Current goals can be found in the Care Plan section)  Acute Rehab PT Goals Patient Stated Goal: return home PT Goal Formulation: With patient/family Time For Goal Achievement: 03/19/17 Potential to Achieve Goals: Good    Frequency Min 3X/week   Barriers to discharge        Co-evaluation               AM-PAC PT "6 Clicks" Daily Activity  Outcome Measure Difficulty turning over in bed (including adjusting bedclothes, sheets and blankets)?: A Little Difficulty moving from lying on back to sitting on the side of the bed? : A Little Difficulty sitting  down on and standing up from a chair with arms (e.g., wheelchair, bedside commode, etc,.)?: A Little Help needed moving to and from a bed to chair (including a wheelchair)?: A Little Help needed walking in hospital room?: A Little Help needed climbing 3-5 steps with a railing? : A Lot 6 Click Score: 17    End of Session   Activity Tolerance: Patient tolerated treatment well Patient left: in chair;with call bell/phone within reach;with family/visitor present Nurse Communication: Mobility status PT Visit Diagnosis: Other abnormalities of gait and mobility (R26.89);Unsteadiness on feet (R26.81)    Time: [id number] PT Time Calculation (min) (ACUTE ONLY): 24 min   Charges:   PT Evaluation $PT Eval Moderate Complexity: 1 Mod PT Treatments $Therapeutic Activity: 8-22 mins   PT G Codes:   PT G-Codes **NOT FOR INPATIENT CLASS** Functional Assessment Tool Used: AM-PAC 6 Clicks Basic Mobility;Clinical judgement Functional Limitation: Mobility: Walking and moving around Mobility: Walking and Moving Around Current Status ([id number](G8978): At least 40 percent but less than 60 percent impaired, limited or restricted Mobility: Walking and Moving Around Goal Status [phone](G8979): At least 1 percent but less than 20 percent impaired, limited or restricted    [facility] LLCJennifer Tilman Mcclaren, [place] CarolinaPT, DPT [phone]   Alessandra BevelsJennifer M Aishani Kalis 03/05/2017, 3:57 PM

## 2017-03-05 NOTE — Progress Notes (Signed)
Nurse notified by IV team that Pt and family are having an altercation.  Entered room to find Pt sitting on bedside and granddaughter (Tammy) yelling in Pt face stating "I am the only one that ever takes care of you and you are ungrateful and you keep saying things just to hurt me. Nobody else will take care of you"  Pt upset and crying.  Nurse asked granddaughter to leave due to making Pt more agitated.  Granddaughter yelling at nurses and continuing to yell at Pt. Security notified and arrived as granddaughter reached the elevator to exit the hospital.  Nurse stayed with Pt to comfort her.  Bedside sitter arrived and will stay with Pt.

## 2017-03-05 NOTE — Progress Notes (Signed)
Pt confused at shift change, believing she is at home and wants to get in her "own bed".  Granddaughter returned to room around 8:30pm stating "what did you give her, she is not normally mean like this" Granddaughter became very angry when told Pt received xanax to help calm her down when she was agitated on previous shift.  Nurse explained use of xanax to granddaughter and Pt.  Assisted Pt back to bed and granddaughter sitting at bedside.

## 2017-03-05 NOTE — Progress Notes (Signed)
[facility] on call notified due to patient seeming confused. Thinks at home and is agitated and crawling out bed. Also mentioned to MD that granddaughter mentioned today that she sometimes gets very forgetful. MD think its probably just change of environment. MD says will order sitter and also notified that I gave xanax around 1730 but patient still off and on agitated. No new orders at this time besides sitter

## 2017-03-05 NOTE — CV Procedure (Signed)
Echocardiogram not completed per patients request. Patient was eating breakfast I offered to set her up and be back in an hour to reattempt. Ms. Yvonne Beck requested that the echo be performed much later this afternoon, her son is visiting her this morning.  Leta Junglingiffany Tyronn Golda RDCS

## 2017-03-06 ENCOUNTER — Inpatient Hospital Stay (HOSPITAL_COMMUNITY): Payer: PPO

## 2017-03-06 DIAGNOSIS — I351 Nonrheumatic aortic (valve) insufficiency: Secondary | ICD-10-CM

## 2017-03-06 LAB — ECHOCARDIOGRAM COMPLETE
AO mean calculated velocity dopler: 206 cm/s
AOPV: 0.39 m/s
AOVTI: 65.9 cm
AV Area VTI index: 0.59 cm2/m2
AV Area VTI: 1 cm2
AV Area mean vel: 0.86 cm2
AV Peak grad: 32 mmHg
AV VEL mean LVOT/AV: 0.34
AV peak Index: 0.66
AV vel: 0.89
AVA: 0.89 cm2
AVAREAMEANVIN: 0.57 cm2/m2
AVG: 19 mmHg
AVPHT: 532 ms
AVPKVEL: 285 cm/s
Area-P 1/2: 3.14 cm2
CHL CUP RV SYS PRESS: 31 mmHg
CHL CUP TV REG PEAK VELOCITY: 263 cm/s
E decel time: 239 msec
EERAT: 16.91
FS: 36 % (ref 28–44)
Height: 60 in
IVS/LV PW RATIO, ED: 0.91
LA ID, A-P, ES: 30 mm
LA vol A4C: 38.8 ml
LADIAMINDEX: 1.99 cm/m2
LAVOL: 38.5 mL
LAVOLIN: 25.5 mL/m2
LDCA: 2.54 cm2
LEFT ATRIUM END SYS DIAM: 30 mm
LV PW d: 11 mm — AB (ref 0.6–1.1)
LV TDI E'LATERAL: 6.09
LV e' LATERAL: 6.09 cm/s
LVEEAVG: 16.91
LVEEMED: 16.91
LVOT VTI: 23.2 cm
LVOT diameter: 18 mm
LVOT peak grad rest: 5 mmHg
LVOTPV: 112 cm/s
LVOTSV: 59 mL
LVOTVTI: 0.35 cm
MV Dec: 239
MV Peak grad: 4 mmHg
MVPKAVEL: 113 m/s
MVPKEVEL: 103 m/s
MVSPHT: 70 ms
RV LATERAL S' VELOCITY: 10.1 cm/s
RV TAPSE: 19 mm
TDI e' medial: 6.64
TRMAXVEL: 263 cm/s
Valve area index: 0.59
Weight: 1947.1 oz

## 2017-03-06 LAB — BASIC METABOLIC PANEL
Anion gap: 8 (ref 5–15)
BUN: 16 mg/dL (ref 6–20)
CALCIUM: 8.7 mg/dL — AB (ref 8.9–10.3)
CO2: 21 mmol/L — ABNORMAL LOW (ref 22–32)
CREATININE: 1.2 mg/dL — AB (ref 0.44–1.00)
Chloride: 109 mmol/L (ref 101–111)
GFR calc Af Amer: 44 mL/min — ABNORMAL LOW (ref >60)
GFR, EST NON AFRICAN AMERICAN: 38 mL/min — AB (ref >60)
GLUCOSE: 96 mg/dL (ref 65–99)
Potassium: 3.7 mmol/L (ref 3.5–5.1)
SODIUM: 138 mmol/L (ref 135–145)

## 2017-03-06 LAB — GLUCOSE, CAPILLARY: GLUCOSE-CAPILLARY: 87 mg/dL (ref 65–99)

## 2017-03-06 NOTE — Progress Notes (Signed)
  Echocardiogram 2D Echocardiogram has been performed.  Larie Mathes T Yasseen Salls 03/06/2017, 3:44 PM

## 2017-03-06 NOTE — NC FL2 (Signed)
[place] MEDICAID FL2 LEVEL OF CARE SCREENING TOOL     IDENTIFICATION  Patient Name: Yvonne Beck Birthdate: [birth year] Sex: female Admission Date (Current Location): 03/04/2017  [facility] [place] [place]   Facility and Address:  [facility], [address], [place], [place] [address]      Provider Number: [id number]  Attending Physician Name and Address:  Alwyn RenMathews, Elizabeth G, MD  Relative Name and Phone Number:       Current Level of Care: Hospital Recommended Level of Care: Skilled Nursing Facility Prior Approval Number:    Date Approved/Denied:   PASRR Number: [id number]  Discharge Plan: SNF    Current Diagnoses: Patient Active Problem List   Diagnosis Date Noted  . AKI (acute kidney injury) (HCC) 03/05/2017  . Acute kidney injury (HCC) 03/04/2017  . Near syncope 03/04/2017  . Acute anxiety 03/04/2017  . Uncontrolled hypertension 03/04/2017  . Postoperative anemia due to acute blood loss 02/11/2012  . Avascular necrosis of femoral head (HCC) 02/10/2012    Orientation RESPIRATION BLADDER Height & Weight     Self  Normal Continent Weight: 121 lb 11.1 oz (55.2 kg) Height:  5' (152.4 cm)  BEHAVIORAL SYMPTOMS/MOOD NEUROLOGICAL BOWEL NUTRITION STATUS      Continent Diet(cardiac)  AMBULATORY STATUS COMMUNICATION OF NEEDS Skin   Limited Assist Verbally Normal                       Personal Care Assistance Level of Assistance  Bathing, Dressing Bathing Assistance: Limited assistance   Dressing Assistance: Limited assistance     Functional Limitations Info             SPECIAL CARE FACTORS FREQUENCY  PT (By licensed PT), OT (By licensed OT)     PT Frequency: 5/wk OT Frequency: 5/wk            Contractures      Additional Factors Info  Code Status, Allergies Code Status Info: FULL Allergies Info: Prednisone           Current Medications (03/06/2017):  This is the current hospital active  medication list Current Facility-Administered Medications  Medication Dose Route Frequency Provider Last Rate Last Dose  . acetaminophen (TYLENOL) tablet 650 mg  650 mg Oral Q6H PRN Opyd, Lavone Neriimothy S, MD      . enoxaparin (LOVENOX) injection 30 mg  30 mg Subcutaneous Q24H Russella DarEllis, Allison L, NP   30 mg at 03/04/17 2126  . ferrous sulfate tablet 325 mg  325 mg Oral TID PC Alwyn RenMathews, Elizabeth G, MD   325 mg at 03/06/17 0739  . hydrALAZINE (APRESOLINE) injection 10 mg  10 mg Intravenous Q4H PRN Emokpae, Courage, MD      . Influenza vac split quadrivalent PF (FLUZONE HIGH-DOSE) injection 0.5 mL  0.5 mL Intramuscular Tomorrow-1000 Alwyn RenMathews, Elizabeth G, MD      . latanoprost (XALATAN) 0.005 % ophthalmic solution 1 drop  1 drop Right Eye q morning - 10a Alwyn RenMathews, Elizabeth G, MD   1 drop at 03/06/17 [id number]  . metoprolol tartrate (LOPRESSOR) tablet 12.5 mg  12.5 mg Oral BID Alwyn RenMathews, Elizabeth G, MD   12.5 mg at 03/06/17 [id number]  . sodium chloride flush (NS) 0.9 % injection 3 mL  3 mL Intravenous Q12H Russella DarEllis, Allison L, NP   3 mL at 03/06/17 0908  . timolol (TIMOPTIC-XR) 0.25 % ophthalmic gel-forming 1 drop  1 drop Right Eye q morning - 10a Alwyn RenMathews, Elizabeth G,  MD   1 drop at 03/06/17 0907     Discharge Medications: Please see discharge summary for a list of discharge medications.  Relevant Imaging Results:  Relevant Lab Results:   Additional Information SS#: [id number]  Burna SisUris, Ranae Casebier H, LCSW

## 2017-03-06 NOTE — Progress Notes (Signed)
4:12pm-CSW called patient's son. He stated that they had been to the hospital but had forgotten to call CSW. He reported his daughter would be coming back to the hospital tonight. CSW explained that if they wanted patient to discharge to a SNF, then we would have to start insurance authorization today. He stated that patient would be coming home tomorrow. CSW expressed understanding and will let RNCM know of home health needs. CSW signing off.   11:32am-CSW spoke with patient's son regarding discharge plan. CSW introduced SNF option. He stated that his daughter is with the patient for most of the day and helps her. He stated that he and his daughter would be coming to the hospital in the afternoon and that they would call CSW with discharge plan.   Yvonne Beck LCSWA [phone]

## 2017-03-06 NOTE — Progress Notes (Signed)
Responded to spiritual care consult to support patient who was having some emotional stress.  Patient did not talk about her stress issues but rather wanted to talk about the great job and benefiical presence of her sitter. This is possible one way she is coping by having good support.  Chaplain available as needed. Venida Jarvis[place], [name], [place], [facility], Pager [phone]

## 2017-03-06 NOTE — Progress Notes (Signed)
PROGRESS NOTE    Yvonne Beck  ZOX:[id number] DOB: 12/23/1923 DOA: 03/04/2017 PCP: Richmond Campbell., PA-C  Brief Narrative 83 y.o.femalewith medical history significant forhypertension, glaucoma, arthritis and history of right hip total arthroplasty in 2013. Patient reports that upon awakening this morning she noticed she was dizzy primarily with standing. Later by walking to the bathroom she had a sensation that she was spinning. The sensation eventually resolved. The sensation did not occur while she was supine. She had at least 2 more these episodes at home prompting her to seek medical attention. Initial blood pressure was 198/72. Patient was also very anxious at presentation and informed the triage nurse "I feel like I am going to die". She further clarified to me that she is always anxious but it was worse today. She has not had any new medications added, she has not had any dosage changes on her current medications, she does not take any diuretics, she has not had any GI illnesses such as nausea vomiting or diarrhea. He was noted to have elevated BUN and slight increase of creatinine consistent with a mild acute kidney injury noting last baseline renal function available was in 2013 for that hospitalization. CT of the head and chest x-ray unremarkable     Assessment & Plan:   Principal Problem:   Acute kidney injury (HCC) Active Problems:   Near syncope   Acute anxiety   Uncontrolled hypertension   AKI (acute kidney injury) (HCC)  1] near syncope patient was treated with IV hydration secondary to possible dehydration and elevated renal functions.  Patient walks with a walker at home.  She reported that she walked to the hospital with a walker without feeling dizzy.  PT eval noted recommendation to snf noted.I will continue to hold her narcotics and baclofen which she takes at home which probably have contributed to her dizziness.  Carotid ultrasound showsRight Carotid:  There is evidence in the right ICA of a 1-39% stenosis.  Left Carotid: There is evidence in the left ICA of a 1-39% stenosis.  Vertebrals: Both vertebral arteries were patent with antegrade flow. 2] acute kidney injury versus acute on chronic CKD-DC IV fluids renal functions improved back to baseline  Renal ultrasound showedBilateral renal atrophy is noted. Increased echogenicity of renal parenchyma is noted suggesting medical renal disease.  ----ace inhibitors is currently on hold. 3] uncontrolled hypertension at the time of admission-she is on IV hydralazine as needed at this time I will started on metoprolol and adjust the dose as needed. 4] anxiety DC Xanax  5] glaucoma restart eyedrops Xalatan and timolol. 6] elevated troponin 0 0.06 will recheck one more level to see the trend.  It is probably secondary to the renal insufficiency that the troponin is elevated.  Patient denies any chest pain at this time    DVT prophylaxis: lovenox Code Status full Family Communication:no family avalable  Disposition Plan:tbd  Consultants: none   Procedures:none Antimicrobials:none  Subjective: Denies chest pain shortness of breath or dizziness she reports that she walked with PT yesterday.   Objective: Events from overnight noted.  Patient currently resting in bed in no acute distress. Vitals:   03/05/17 2224 03/06/17 0016 03/06/17 0152 03/06/17 0739  BP: (!) 221/85 (!) 159/74 (!) 129/94 (!) 166/84  Pulse: (!) 57 (!) 58 60 65  Resp:   16 12  Temp:   98.1 F (36.7 C) (!) 97.5 F (36.4 C)  TempSrc:    Oral  SpO2:   98% 99%  Weight:      Height:        Intake/Output Summary (Last 24 hours) at 03/06/2017 1132 Last data filed at 03/06/2017 1027 Gross per 24 hour  Intake 538.75 ml  Output -  Net 538.75 ml   Filed Weights   03/04/17 0926 03/04/17 1721 03/05/17 0551  Weight: 54.4 kg (120 lb) 55.2 kg (121 lb 11.1 oz) 55.2 kg (121 lb 11.1 oz)    Examination:  General exam: Appears  calm and comfortable  Respiratory system: Clear to auscultation. Respiratory effort normal. Cardiovascular system: S1 & S2 heard, RRR. No JVD, murmurs, rubs, gallops or clicks. No pedal edema. Gastrointestinal system: Abdomen is nondistended, soft and nontender. No organomegaly or masses felt. Normal bowel sounds heard. Central nervous system: Alert and oriented. No focal neurological deficits. Extremities: Symmetric 5 x 5 power. Skin: No rashes, lesions or ulcers Psychiatry: Judgement and insight appear normal. Mood & affect appropriate.     Data Reviewed: I have personally reviewed following labs and imaging studies  CBC: Recent Labs  Lab 03/04/17 1010 03/05/17 0223  WBC 5.7 7.6  HGB 13.2 11.4*  HCT 40.3 35.7*  MCV 96.9 96.5  PLT 222 195   Basic Metabolic Panel: Recent Labs  Lab 03/04/17 1010 03/05/17 0223 03/06/17 0714  NA 138 140 138  K 4.7 4.3 3.7  CL 104 110 109  CO2 24 20* 21*  GLUCOSE 98 100* 96  BUN 29* 24* 16  CREATININE 1.53* 1.47* 1.20*  CALCIUM 9.4 8.6* 8.7*   GFR: Estimated Creatinine Clearance: 23.8 mL/min (A) (by C-G formula based on SCr of 1.2 mg/dL (H)). Liver Function Tests: Recent Labs  Lab 03/05/17 0223  AST 15  ALT 7*  ALKPHOS 62  BILITOT 0.6  PROT 5.9*  ALBUMIN 3.3*   No results for input(s): LIPASE, AMYLASE in the last 168 hours. No results for input(s): AMMONIA in the last 168 hours. Coagulation Profile: Recent Labs  Lab 03/04/17 1015  INR 1.00   Cardiac Enzymes: Recent Labs  Lab 03/04/17 1010 03/04/17 2106 03/05/17 0223 03/05/17 1048  TROPONINI <0.03 <0.03 0.06* 0.06*   BNP (last 3 results) No results for input(s): PROBNP in the last 8760 hours. HbA1C: No results for input(s): HGBA1C in the last 72 hours. CBG: Recent Labs  Lab 03/04/17 1015 03/05/17 0550 03/06/17 0744  GLUCAP 95 85 87   Lipid Profile: No results for input(s): CHOL, HDL, LDLCALC, TRIG, CHOLHDL, LDLDIRECT in the last 72 hours. Thyroid Function  Tests: No results for input(s): TSH, T4TOTAL, FREET4, T3FREE, THYROIDAB in the last 72 hours. Anemia Panel: No results for input(s): VITAMINB12, FOLATE, FERRITIN, TIBC, IRON, RETICCTPCT in the last 72 hours. Sepsis Labs: No results for input(s): PROCALCITON, LATICACIDVEN in the last 168 hours.  No results found for this or any previous visit (from the past 240 hour(s)).       Radiology Studies: [place] Renal  Result Date: 03/04/2017 CLINICAL DATA:  Acute kidney injury. EXAM: RENAL / URINARY TRACT ULTRASOUND COMPLETE COMPARISON:  Ultrasound of June 09, 2009. FINDINGS: Right Kidney: Length: 8.6 cm. Increased echogenicity of renal parenchyma is noted suggesting medical renal disease. 7 mm cyst is noted. No mass or hydronephrosis visualized. Left Kidney: Length: 8.1 cm. Increased echogenicity of renal parenchyma is noted suggesting medical renal disease. 2 cysts are noted, with the largest measuring 8 mm. No mass or hydronephrosis visualized. Bladder: Appears normal for degree of bladder distention. IMPRESSION: Bilateral renal atrophy is noted. Increased echogenicity of renal parenchyma is noted  suggesting medical renal disease. Electronically Signed   By: Lupita RaiderJames  Green Jr, M.D.   On: 03/04/2017 14:56        Scheduled Meds: . enoxaparin (LOVENOX) injection  30 mg Subcutaneous Q24H  . ferrous sulfate  325 mg Oral TID PC  . Influenza vac split quadrivalent PF  0.5 mL Intramuscular Tomorrow-1000  . latanoprost  1 drop Right Eye q morning - 10a  . metoprolol tartrate  12.5 mg Oral BID  . sodium chloride flush  3 mL Intravenous Q12H  . timolol  1 drop Right Eye q morning - 10a   Continuous Infusions:   LOS: 1 day     Alwyn RenElizabeth G Mathews, MD [facility] If 7PM-7AM, please contact night-coverage [email or web address] Password [email or web address] 03/06/2017, 11:32 AM

## 2017-03-06 NOTE — Progress Notes (Signed)
  Echocardiogram 2D Echocardiogram has been performed.  Joshuwa Vecchio T Rikia Sukhu 03/06/2017, 4:49 PM

## 2017-03-06 NOTE — Progress Notes (Signed)
Pt refusing vitals, glucose check and telemetry at this time.  States "I am fine, I feel fine.  I just need to rest" Explained to Pt the need for monitoring. Pt states "you can do it in the morning after 8". MD notified.

## 2017-03-06 NOTE — Progress Notes (Signed)
Physical Therapy Treatment Patient Details Name: Yvonne Beck MRN: [id number] DOB: 06-13-1923 Today's Date: 03/06/2017    History of Present Illness Pt is a 83 y/o female admitted secondary to having a near syncopal episode, and found to have an AKI. PMH including but not limited arthritis, HTN and R THA in 2013.    PT Comments    Pt remains somewhat confused, but oriented to self, pleasant, interactive, and happily participatory. Session focus on improving strength, safety with basic mobility, and static balance activity at EOB. Pt progressing well toward goals overall, now with improved gait tolerance. Pt reports feeling tired at end of session, but in good spirits. Intermittent confusion with following commands during balance training, but patient responds well with visual and tactile cues.      Follow Up Recommendations  SNF;Supervision/Assistance - 24 hour(will need 24/7 assistance at home should patient and/or family refuse STR placement)     Equipment Recommendations  None recommended by PT    Recommendations for Other Services       Precautions / Restrictions Precautions Precautions: Fall Precaution Comments: pt admitted d/t several falls over the past 6 months Restrictions Weight Bearing Restrictions: No    Mobility  Bed Mobility Overal bed mobility: Needs Assistance Bed Mobility: Sit to Supine       Sit to supine: Min assist   General bed mobility comments: MinA to help with RLE into bed, guarding of head d/t poor trunk control.   Transfers Overall transfer level: Needs assistance Equipment used: Rolling walker (2 wheeled) Transfers: Sit to/from Stand Sit to Stand: Supervision         General transfer comment: supervision with armrails, minguard assist using RW BUE; education on splitting UE hold between arm-rails and RW, with improved form and decreased required effort   Ambulation/Gait Ambulation/Gait assistance: Min guard Ambulation Distance (Feet):  80 Feet Assistive device: Rolling walker (2 wheeled)   Gait velocity: 0.3175m/s    General Gait Details: generally stable, but easily distractible and several instances of impulsivity.    Stairs            Wheelchair Mobility    Modified Rankin (Stroke Patients Only)       Balance Overall balance assessment: Needs assistance;History of Falls Sitting-balance support: Feet supported Sitting balance-Leahy Scale: Good     Standing balance support: During functional activity;No upper extremity supported Standing balance-Leahy Scale: Good                              Cognition Arousal/Alertness: Awake/alert Behavior During Therapy: WFL for tasks assessed/performed Overall Cognitive Status: No family/caregiver present to determine baseline cognitive functioning Area of Impairment: Orientation                 Orientation Level: Person     Following Commands: Follows one step commands consistently;Follows multi-step commands inconsistently              Exercises Other Exercises Other Exercises: STS transfers from recliner 1x8 Other Exercises: narrow stance balance 1x60sec Other Exercises: normal stance balance with vertical head turns 8x bilat, vertical head turns 8x up/down Other Exercises: normal stance balance with reciprocal single UE forward reach 8-inches.     General Comments        Pertinent Vitals/Pain Pain Assessment: No/denies pain    Home Living  Prior Function            PT Goals (current goals can now be found in the care plan section) Acute Rehab PT Goals Patient Stated Goal: return home PT Goal Formulation: With patient/family Time For Goal Achievement: 03/19/17 Potential to Achieve Goals: Good Progress towards PT goals: Progressing toward goals    Frequency    Min 3X/week      PT Plan Current plan remains appropriate    Co-evaluation              AM-PAC PT "6 Clicks"  Daily Activity  Outcome Measure  Difficulty turning over in bed (including adjusting bedclothes, sheets and blankets)?: A Little Difficulty moving from lying on back to sitting on the side of the bed? : A Lot Difficulty sitting down on and standing up from a chair with arms (e.g., wheelchair, bedside commode, etc,.)?: A Lot Help needed moving to and from a bed to chair (including a wheelchair)?: A Little Help needed walking in hospital room?: None Help needed climbing 3-5 steps with a railing? : A Little 6 Click Score: 17    End of Session Equipment Utilized During Treatment: Gait belt Activity Tolerance: Patient tolerated treatment well Patient left: with call bell/phone within reach;in bed;with bed alarm set(echo asks to return patient to supine for procedure) Nurse Communication: Mobility status PT Visit Diagnosis: Other abnormalities of gait and mobility (R26.89);Unsteadiness on feet (R26.81)     Time: [id number] PT Time Calculation (min) (ACUTE ONLY): 24 min  Charges:  $Therapeutic Activity: 8-22 mins $Neuromuscular Re-education: 8-22 mins                    G Codes:      3:24 PM, 03/06/17 Rosamaria LintsAllan C Buccola, PT, DPT [facility] Therapist - [place] [phone] (Pager)  [phone] (Mobile)  [phone] (Office)      Buccola,Allan C 03/06/2017, 3:21 PM

## 2017-03-07 LAB — GLUCOSE, CAPILLARY
GLUCOSE-CAPILLARY: 84 mg/dL (ref 65–99)
Glucose-Capillary: 82 mg/dL (ref 65–99)

## 2017-03-07 MED ORDER — HYDRALAZINE HCL 25 MG PO TABS
25.0000 mg | ORAL_TABLET | Freq: Once | ORAL | Status: AC
  Administered 2017-03-07: 25 mg via ORAL
  Filled 2017-03-07: qty 1

## 2017-03-07 MED ORDER — ACETAMINOPHEN 325 MG PO TABS: 650.0000 mg | ORAL_TABLET | Freq: Four times a day (QID) | ORAL | Status: AC | PRN

## 2017-03-07 MED ORDER — HYDRALAZINE HCL 25 MG PO TABS: 25.0000 mg | ORAL_TABLET | Freq: Three times a day (TID) | ORAL | Status: DC

## 2017-03-07 MED ORDER — HYDRALAZINE HCL 25 MG PO TABS: 25.0000 mg | ORAL_TABLET | Freq: Three times a day (TID) | ORAL | 0 refills | Status: AC

## 2017-03-07 MED ORDER — METOPROLOL TARTRATE 25 MG PO TABS: 12.5000 mg | ORAL_TABLET | Freq: Two times a day (BID) | ORAL | 0 refills | Status: AC

## 2017-03-07 NOTE — Consult Note (Signed)
            [facility] Primary Care Navigator  03/07/2017  Pixie CasinoBetty J Cranfield 1923-10-12 [id number]   Went to see patient at the bedside to identify possible discharge needs but she was alreadydischarged per staff report.  Patient was discharged home today with home health services per Inpatient CM note.  Patient has discharge instruction to follow-up with primary care provider (PCP- [name], [name], PA-C) within 1-2 weeks.   For additional questions please contact:  Karin GoldenLorraine A. Neelam Tiggs, BSN, RN-BC [facility] PRIMARY [facility] Navigator Cell: [phone]

## 2017-03-07 NOTE — Care Management Note (Signed)
Case Management Note  Patient Details  Name: Yvonne Beck MRN: [id number] Date of Birth: 1923/09/24  Subjective/Objective:       Admitted with acute kidney injury.             PCP: Mady GemmaKristen Kaplan  Action/Plan: Transitioning to home with home health services in place (PT,OT).  Expected Discharge Date:  03/07/17               Expected Discharge Plan:   home with home health   In-House Referral:     Discharge planning Services     Post Acute Care Choice:    Choice offered to:     DME Arranged:    DME Agency:     HH Arranged:   PT,OT HH Agency:   [facility], referral made with Lupita LeashDonna @ [phone].  Status of Service:   completed  If discussed at Long Length of Stay Meetings, dates discussed:    Additional Comments:  Yvonne Beck, Yvonne Macdonnell Hudson, RN 03/07/2017, 11:41 AM

## 2017-03-07 NOTE — Discharge Summary (Signed)
Physician Discharge Summary  Yvonne Beck AVW:[id number] DOB: 1924/01/27 DOA: 03/04/2017  PCP: [name], [name], PA-C  Admit date: 03/04/2017 Discharge date: 03/07/2017  Admitted From home :Disposition home  Recommendations for Outpatient Follow-up:  1. Follow up with PCP in 1-2 weeks 2. Please obtain BMP/CBC in one week Home Health yes Equipment/Devicesnone Discharge Conditionstable CODE STATUSfull Diet recommendation:cardiac Brief/Interim Summary:83 y.o.femalewith medical history significant forhypertension, glaucoma, arthritis and history of right hip total arthroplasty in 2013. Patient reports that upon awakening this morning she noticed she was dizzy primarily with standing. Later by walking to the bathroom she had a sensation that she was spinning. The sensation eventually resolved. The sensation did not occur while she was supine. She had at least 2 more these episodes at home prompting her to seek medical attention. Initial blood pressure was 198/72. Patient was also very anxious at presentation and informed the triage nurse "I feel like I am going to die". She further clarified to me that she is always anxious but it was worse today. She has not had any new medications added, she has not had any dosage changes on her current medications, she does not take any diuretics, she has not had any GI illnesses such as nausea vomiting or diarrhea. He was noted to have elevated BUN and slight increase of creatinine consistent with a mild acute kidney injury noting last baseline renal function available was in 2013 for that hospitalization. CT of the head and chest x-ray unremarkable     Discharge Diagnoses:  Principal Problem:   Acute kidney injury (HCC) Active Problems:   Near syncope   Acute anxiety   Uncontrolled hypertension   AKI (acute kidney injury) (HCC)  1]near syncope patient was treated with IV hydration secondary to possible dehydration and elevated renal  functions. Patient walks with a walker at home. She reported that she walked to the hospital with a walker without feeling dizzy.  PT eval noted recommendation to snf noted.I will continue to hold her narcotics and baclofen which she takes at home which probably have contributed to her dizziness.  Carotid ultrasound showsRight Carotid: There is evidence in the right ICA of a 1-39% stenosis.  Left Carotid: There is evidence in the left ICA of a 1-39% stenosis.  Vertebrals: Both vertebral arteries were patent with antegrade flow. 2]acute kidney injury versus acute on chronic CKD-DC IV fluids renal functions improved back to baseline Renal ultrasound showedBilateral renal atrophy is noted. Increased echogenicity of renal parenchyma is noted suggesting medical renal disease. ----ace inhibitors is currently on hold. 3]uncontrolled hypertension at the time of admission-she is on IV hydralazine as needed at this time I will started on metoprolol and adjust the dose as needed.will start po hydralazine upon dc for better control of bp. 4]anxiety DC Xanax  5]glaucoma restart eyedrops Xalatan and timolol. 6]elevated troponin 0 0.06 will recheck one more level to see the trend. It is probably secondary to the renal insufficiency that the troponin is elevated. Patient denies any chest pain at this time     Discharge Instructions   Allergies as of 03/07/2017      Reactions   Prednisone Other (See Comments)   Unspecified       Medication List    STOP taking these medications   bisacodyl 10 MG suppository Commonly known as:  DULCOLAX   HYDROcodone-acetaminophen 5-325 MG tablet Commonly known as:  NORCO/VICODIN   lisinopril 40 MG tablet Commonly known as:  PRINIVIL,ZESTRIL   methocarbamol 500 MG tablet  Commonly known as:  ROBAXIN   warfarin 2.5 MG tablet Commonly known as:  COUMADIN     TAKE these medications   acetaminophen 325 MG tablet Commonly known as:  TYLENOL Take 2  tablets (650 mg total) by mouth every 6 (six) hours as needed for mild pain or headache.   ferrous sulfate 325 (65 FE) MG tablet Take 1 tablet (325 mg total) by mouth 3 (three) times daily after meals.   latanoprost 0.005 % ophthalmic solution Commonly known as:  XALATAN Place 1 drop into the right eye every morning.   metoprolol tartrate 25 MG tablet Commonly known as:  LOPRESSOR Take 0.5 tablets (12.5 mg total) by mouth 2 (two) times daily.   timolol 0.25 % ophthalmic gel-forming Commonly known as:  TIMOPTIC-XR Place 1 drop into the right eye every morning.       Allergies  Allergen Reactions  . Prednisone Other (See Comments)    Unspecified     Consultations:none   Procedures/Studies: Ct Head Wo Contrast  Result Date: 03/04/2017 CLINICAL DATA:  Gait disturbance.  Syncope. EXAM: CT HEAD WITHOUT CONTRAST TECHNIQUE: Contiguous axial images were obtained from the base of the skull through the vertex without intravenous contrast. COMPARISON:  None. FINDINGS: Brain: There is mild diffuse atrophy. There is no intracranial mass, hemorrhage, extra-axial fluid collection, or midline shift. There is patchy small vessel disease in the centra semiovale bilaterally. There is also small vessel disease in each internal capsule and thalamus region. No well-defined acute infarct evident. Vascular: There is no appreciable hyperdense vessel. There is calcification in each carotid siphon and distal vertebral artery. Skull: Bony calvarium appears intact. Sinuses/Orbits: There is mucosal thickening in multiple ethmoid air cells. Visualized paranasal sinuses elsewhere clear. There are no apparent intraorbital lesions. Other: Mastoid air cells are clear. IMPRESSION: Mild atrophy with patchy supratentorial small vessel disease. No evident acute infarct. No mass or hemorrhage. There are foci of arterial vascular calcification. There is mucosal thickening in multiple ethmoid air cells. Electronically Signed    By: Bretta Bang III M.D.   On: 03/04/2017 10:36   US Renal  Result Date: 03/04/2017 CLINICAL DATA:  Acute kidney injury. EXAM: RENAL / URINARY TRACT ULTRASOUND COMPLETE COMPARISON:  Ultrasound of June 09, 2009. FINDINGS: Right Kidney: Length: 8.6 cm. Increased echogenicity of renal parenchyma is noted suggesting medical renal disease. 7 mm cyst is noted. No mass or hydronephrosis visualized. Left Kidney: Length: 8.1 cm. Increased echogenicity of renal parenchyma is noted suggesting medical renal disease. 2 cysts are noted, with the largest measuring 8 mm. No mass or hydronephrosis visualized. Bladder: Appears normal for degree of bladder distention. IMPRESSION: Bilateral renal atrophy is noted. Increased echogenicity of renal parenchyma is noted suggesting medical renal disease. Electronically Signed   By: Lupita Raider, M.D.   On: 03/04/2017 14:56   Dg Chest Port 1 View  Result Date: 03/04/2017 CLINICAL DATA:  Shortness of breath, syncope EXAM: PORTABLE CHEST 1 VIEW COMPARISON:  02/10/2012 FINDINGS: Chronic interstitial prominence in the apices and lung bases, likely scarring. Heart is normal size. No confluent airspace opacities or effusions. No acute bony abnormality. IMPRESSION: Chronic changes.  No active disease. Electronically Signed   By: Charlett Nose M.D.   On: 03/04/2017 09:58    (Echo, Carotid, EGD, Colonoscopy, ERCP)    Subjective:no complaints    Discharge Exam: Vitals:   03/07/17 0610 03/07/17 0801  BP: (!) 168/83 (!) 185/69  Pulse: 61 60  Resp: 18 14  Temp: 98.1  F (36.7 C) 98 F (36.7 C)  SpO2: 97% 100%   Vitals:   03/06/17 1546 03/06/17 2002 03/07/17 0610 03/07/17 0801  BP: (!) 145/85 (!) 154/76 (!) 168/83 (!) 185/69  Pulse: 65 62 61 60  Resp: 18 18 18 14   Temp: 98.5 F (36.9 C) 98 F (36.7 C) 98.1 F (36.7 C) 98 F (36.7 C)  TempSrc: Oral Oral Oral Oral  SpO2: 93% 98% 97% 100%  Weight:      Height:        General: Pt is alert, awake, not in acute  distress Cardiovascular: RRR, S1/S2 +, no rubs, no gallops Respiratory: CTA bilaterally, no wheezing, no rhonchi Abdominal: Soft, NT, ND, bowel sounds + Extremities: no edema, no cyanosis    The results of significant diagnostics from this hospitalization (including imaging, microbiology, ancillary and laboratory) are listed below for reference.     Microbiology: No results found for this or any previous visit (from the past 240 hour(s)).   Labs: BNP (last 3 results) No results for input(s): BNP in the last 8760 hours. Basic Metabolic Panel: Recent Labs  Lab 03/04/17 1010 03/05/17 0223 03/06/17 0714  NA 138 140 138  K 4.7 4.3 3.7  CL 104 110 109  CO2 24 20* 21*  GLUCOSE 98 100* 96  BUN 29* 24* 16  CREATININE 1.53* 1.47* 1.20*  CALCIUM 9.4 8.6* 8.7*   Liver Function Tests: Recent Labs  Lab 03/05/17 0223  AST 15  ALT 7*  ALKPHOS 62  BILITOT 0.6  PROT 5.9*  ALBUMIN 3.3*   No results for input(s): LIPASE, AMYLASE in the last 168 hours. No results for input(s): AMMONIA in the last 168 hours. CBC: Recent Labs  Lab 03/04/17 1010 03/05/17 0223  WBC 5.7 7.6  HGB 13.2 11.4*  HCT 40.3 35.7*  MCV 96.9 96.5  PLT 222 195   Cardiac Enzymes: Recent Labs  Lab 03/04/17 1010 03/04/17 2106 03/05/17 0223 03/05/17 1048  TROPONINI <0.03 <0.03 0.06* 0.06*   BNP: Invalid input(s): POCBNP CBG: Recent Labs  Lab 03/04/17 1015 03/05/17 0550 03/06/17 0744 03/07/17 0607 03/07/17 0803  GLUCAP 95 85 87 84 82   D-Dimer No results for input(s): DDIMER in the last 72 hours. Hgb A1c No results for input(s): HGBA1C in the last 72 hours. Lipid Profile No results for input(s): CHOL, HDL, LDLCALC, TRIG, CHOLHDL, LDLDIRECT in the last 72 hours. Thyroid function studies No results for input(s): TSH, T4TOTAL, T3FREE, THYROIDAB in the last 72 hours.  Invalid input(s): FREET3 Anemia work up No results for input(s): VITAMINB12, FOLATE, FERRITIN, TIBC, IRON, RETICCTPCT in the  last 72 hours. Urinalysis    Component Value Date/Time   COLORURINE STRAW (A) 03/04/2017 1119   APPEARANCEUR CLEAR 03/04/2017 1119   LABSPEC 1.006 03/04/2017 1119   PHURINE 7.0 03/04/2017 1119   GLUCOSEU NEGATIVE 03/04/2017 1119   HGBUR NEGATIVE 03/04/2017 1119   BILIRUBINUR NEGATIVE 03/04/2017 1119   KETONESUR NEGATIVE 03/04/2017 1119   PROTEINUR 100 (A) 03/04/2017 1119   UROBILINOGEN 0.2 02/10/2012 1226   NITRITE NEGATIVE 03/04/2017 1119   LEUKOCYTESUR TRACE (A) 03/04/2017 1119   Sepsis Labs Invalid input(s): PROCALCITONIN,  WBC,  LACTICIDVEN Microbiology No results found for this or any previous visit (from the past 240 hour(s)).   Time coordinating discharge: Over 30 minutes  SIGNED:   Alwyn RenElizabeth G Mathews, MD  [facility] 03/07/2017, 10:06 AM Pager   If 7PM-7AM, please contact night-coverage [email or web address] Password [email or web address]

## 2017-03-07 NOTE — Progress Notes (Signed)
Patient discharge instructions given to patient and granddaughter.  Patient and family member verbalized understanding of discharge instructions.  Discharge vitals Vitals:   03/07/17 1020 03/07/17 1207  BP: 139/77 127/69  Pulse: 60 (!) 54  Resp:  15  Temp:  98.3 F (36.8 C)  SpO2:  97%   Discharge AVS Allergies as of 03/07/2017      Reactions   Prednisone Other (See Comments)   Unspecified       Medication List    STOP taking these medications   bisacodyl 10 MG suppository Commonly known as:  DULCOLAX   HYDROcodone-acetaminophen 5-325 MG tablet Commonly known as:  NORCO/VICODIN   lisinopril 40 MG tablet Commonly known as:  PRINIVIL,ZESTRIL   methocarbamol 500 MG tablet Commonly known as:  ROBAXIN   warfarin 2.5 MG tablet Commonly known as:  COUMADIN     TAKE these medications   acetaminophen 325 MG tablet Commonly known as:  TYLENOL Take 2 tablets (650 mg total) by mouth every 6 (six) hours as needed for mild pain or headache.   ferrous sulfate 325 (65 FE) MG tablet Take 1 tablet (325 mg total) by mouth 3 (three) times daily after meals.   hydrALAZINE 25 MG tablet Commonly known as:  APRESOLINE Take 1 tablet (25 mg total) by mouth every 8 (eight) hours.   latanoprost 0.005 % ophthalmic solution Commonly known as:  XALATAN Place 1 drop into the right eye every morning.   metoprolol tartrate 25 MG tablet Commonly known as:  LOPRESSOR Take 0.5 tablets (12.5 mg total) by mouth 2 (two) times daily.   timolol 0.25 % ophthalmic gel-forming Commonly known as:  TIMOPTIC-XR Place 1 drop into the right eye every morning.       Telemetry removed and PIV removed.  Patient getting dress and Nurse tech will escort patient down once she is dressed.   Shanon BrowGlodean [phone]

## 2017-03-17 ENCOUNTER — Telehealth: Payer: Self-pay | Admitting: Family Medicine

## 2017-03-17 DIAGNOSIS — I10 Essential (primary) hypertension: Secondary | ICD-10-CM | POA: Diagnosis not present

## 2017-03-17 DIAGNOSIS — H409 Unspecified glaucoma: Secondary | ICD-10-CM | POA: Diagnosis not present

## 2017-03-17 DIAGNOSIS — R55 Syncope and collapse: Secondary | ICD-10-CM | POA: Diagnosis not present

## 2017-03-17 DIAGNOSIS — F419 Anxiety disorder, unspecified: Secondary | ICD-10-CM | POA: Diagnosis not present

## 2017-03-21 NOTE — Telephone Encounter (Signed)
No info, will await rtc

## 2017-04-08 DIAGNOSIS — D649 Anemia, unspecified: Secondary | ICD-10-CM | POA: Diagnosis not present

## 2017-04-08 DIAGNOSIS — R42 Dizziness and giddiness: Secondary | ICD-10-CM | POA: Diagnosis not present

## 2017-04-08 DIAGNOSIS — I10 Essential (primary) hypertension: Secondary | ICD-10-CM | POA: Diagnosis not present

## 2017-04-10 DIAGNOSIS — D649 Anemia, unspecified: Secondary | ICD-10-CM | POA: Diagnosis not present

## 2017-04-10 DIAGNOSIS — R42 Dizziness and giddiness: Secondary | ICD-10-CM | POA: Diagnosis not present

## 2017-04-10 DIAGNOSIS — I10 Essential (primary) hypertension: Secondary | ICD-10-CM | POA: Diagnosis not present

## 2017-05-26 ENCOUNTER — Other Ambulatory Visit: Payer: Self-pay

## 2017-05-26 ENCOUNTER — Emergency Department (HOSPITAL_COMMUNITY): Payer: PPO

## 2017-05-26 ENCOUNTER — Encounter (HOSPITAL_COMMUNITY): Payer: Self-pay

## 2017-05-26 ENCOUNTER — Emergency Department (HOSPITAL_COMMUNITY)
Admission: EM | Admit: 2017-05-26 | Discharge: 2017-05-26 | Disposition: A | Discharge status: Home / Self Care | Payer: PPO | Attending: Emergency Medicine | Admitting: Emergency Medicine

## 2017-05-26 DIAGNOSIS — Z87891 Personal history of nicotine dependence: Secondary | ICD-10-CM | POA: Insufficient documentation

## 2017-05-26 DIAGNOSIS — Y92010 Kitchen of single-family (private) house as the place of occurrence of the external cause: Secondary | ICD-10-CM | POA: Insufficient documentation

## 2017-05-26 DIAGNOSIS — M9701XA Periprosthetic fracture around internal prosthetic right hip joint, initial encounter: Secondary | ICD-10-CM | POA: Insufficient documentation

## 2017-05-26 DIAGNOSIS — Y999 Unspecified external cause status: Secondary | ICD-10-CM | POA: Diagnosis not present

## 2017-05-26 DIAGNOSIS — Z79899 Other long term (current) drug therapy: Secondary | ICD-10-CM | POA: Insufficient documentation

## 2017-05-26 DIAGNOSIS — M978XXA Periprosthetic fracture around other internal prosthetic joint, initial encounter: Secondary | ICD-10-CM

## 2017-05-26 DIAGNOSIS — M25559 Pain in unspecified hip: Secondary | ICD-10-CM | POA: Diagnosis not present

## 2017-05-26 DIAGNOSIS — S79911A Unspecified injury of right hip, initial encounter: Secondary | ICD-10-CM | POA: Diagnosis not present

## 2017-05-26 DIAGNOSIS — Y9301 Activity, walking, marching and hiking: Secondary | ICD-10-CM | POA: Diagnosis not present

## 2017-05-26 DIAGNOSIS — M25551 Pain in right hip: Secondary | ICD-10-CM | POA: Diagnosis not present

## 2017-05-26 DIAGNOSIS — W1830XA Fall on same level, unspecified, initial encounter: Secondary | ICD-10-CM | POA: Insufficient documentation

## 2017-05-26 DIAGNOSIS — W19XXXA Unspecified fall, initial encounter: Secondary | ICD-10-CM

## 2017-05-26 DIAGNOSIS — Z96649 Presence of unspecified artificial hip joint: Secondary | ICD-10-CM

## 2017-05-26 DIAGNOSIS — I10 Essential (primary) hypertension: Secondary | ICD-10-CM | POA: Insufficient documentation

## 2017-05-26 DIAGNOSIS — R52 Pain, unspecified: Secondary | ICD-10-CM | POA: Diagnosis not present

## 2017-05-26 MED ORDER — ACETAMINOPHEN 500 MG PO TABS
1000.0000 mg | ORAL_TABLET | Freq: Once | ORAL | Status: AC
  Administered 2017-05-26: 1000 mg via ORAL
  Filled 2017-05-26: qty 2

## 2017-05-26 MED ORDER — TRAMADOL HCL 50 MG PO TABS: 50.0000 mg | ORAL_TABLET | Freq: Four times a day (QID) | ORAL | 0 refills | Status: AC | PRN

## 2017-05-26 NOTE — ED Triage Notes (Signed)
EMS reports fall from standing right hip pain, no shortening or rotation, no obvious deformity. Prior Hip replacement right side.  BP 130/84 HR 61 Resp 16 Sp02 96 RA CBG 111

## 2017-05-26 NOTE — ED Provider Notes (Signed)
[facility]-EMERGENCY DEPT Provider Note   CSN: [id number] Arrival date & time: 05/26/17  1633     History   Chief Complaint Chief Complaint  Patient presents with  . Fall    HPI Yvonne Beck is a 84 y.o. female.  HPI Patient presents to the emergency department with right hip pain following a fall that occurred just prior to arrival.  The patient states she was not using her walker when she slipped in her kitchen and she fell onto the right hip but did not fall down fully and hit her head.  The patient states that movement and palpation make the pain worse.  She states she did not take any medications prior to arrival.  Patient states that she has had a right hip replacement.  The patient denies chest pain, shortness of breath, headache,blurred vision, neck pain, fever, cough, weakness, numbness, dizziness, anorexia, edema, abdominal pain, nausea, vomiting, diarrhea, rash, back pain, dysuria, hematemesis, bloody stool, near syncope, or syncope. Past Medical History:  Diagnosis Date  . Anxiety   . Arthritis   . Glaucoma, both eyes   . Hypertension     Patient Active Problem List   Diagnosis Date Noted  . AKI (acute kidney injury) (HCC) 03/05/2017  . Acute kidney injury (HCC) 03/04/2017  . Near syncope 03/04/2017  . Acute anxiety 03/04/2017  . Uncontrolled hypertension 03/04/2017  . Postoperative anemia due to acute blood loss 02/11/2012  . Avascular necrosis of femoral head (HCC) 02/10/2012    Past Surgical History:  Procedure Laterality Date  . JOINT REPLACEMENT    . TOTAL HIP ARTHROPLASTY  02/10/2012   Procedure: TOTAL HIP ARTHROPLASTY;  Surgeon: Jacki Conesonald A Gioffre, MD;  Location: [facility];  Service: Orthopedics;  Laterality: Right;    OB History    No data available       Home Medications    Prior to Admission medications   Medication Sig Start Date End Date Taking? Authorizing Provider  acetaminophen (TYLENOL) 325 MG tablet Take 2 tablets  (650 mg total) by mouth every 6 (six) hours as needed for mild pain or headache. 03/07/17  Yes Alwyn RenMathews, Elizabeth G, MD  hydrALAZINE (APRESOLINE) 25 MG tablet Take 1 tablet (25 mg total) by mouth every 8 (eight) hours. 03/07/17  Yes Alwyn RenMathews, Elizabeth G, MD  latanoprost (XALATAN) 0.005 % ophthalmic solution Place 1 drop into the right eye every morning.   Yes [provider]  metoprolol tartrate (LOPRESSOR) 25 MG tablet Take 0.5 tablets (12.5 mg total) by mouth 2 (two) times daily. 03/07/17  Yes Alwyn RenMathews, Elizabeth G, MD  timolol (TIMOPTIC-XR) 0.25 % ophthalmic gel-forming Place 1 drop into the right eye every morning.   Yes [provider]  ferrous sulfate 325 (65 FE) MG tablet Take 1 tablet (325 mg total) by mouth 3 (three) times daily after meals. Patient not taking: Reported on 03/04/2017 02/14/12   Dimitri Pedonstable, Amber, PA-C    Family History History reviewed. No pertinent family history.  Social History Social History   Tobacco Use  . Smoking status: Former Games developermoker  . Smokeless tobacco: Never Used  Substance Use Topics  . Alcohol use: No  . Drug use: No     Allergies   Prednisone   Review of Systems Review of Systems All other systems negative except as documented in the HPI. All pertinent positives and negatives as reviewed in the HPI.  Physical Exam Updated Vital Signs BP (!) 154/82 (BP Location: Left Arm)   Pulse Marland Kitchen(!)  58   Temp 98.1 F (36.7 C) (Oral)   Resp 16   SpO2 97%   Physical Exam  Constitutional: She is oriented to person, place, and time. She appears well-developed and well-nourished. No distress.  HENT:  Head: Normocephalic and atraumatic.  Mouth/Throat: Oropharynx is clear and moist.  Eyes: Pupils are equal, round, and reactive to light.  Neck: Normal range of motion. Neck supple.  Cardiovascular: Normal rate, regular rhythm and normal heart sounds. Exam reveals no gallop and no friction rub.  No murmur heard. Pulmonary/Chest: Effort normal  and breath sounds normal. No respiratory distress. She has no wheezes.  Musculoskeletal:       Right hip: She exhibits decreased range of motion and tenderness.       Legs: Neurological: She is alert and oriented to person, place, and time. She exhibits normal muscle tone. Coordination normal.  Skin: Skin is warm and dry. Capillary refill takes less than 2 seconds. No rash noted. No erythema.  Psychiatric: She has a normal mood and affect. Her behavior is normal.  Nursing note and vitals reviewed.    ED Treatments / Results  Labs (all labs ordered are listed, but only abnormal results are displayed) Labs Reviewed - No data to display  EKG  EKG Interpretation None       Radiology Ct Hip Right Wo Contrast  Result Date: 05/26/2017 CLINICAL DATA:  Right hip pain after falling from standing position. Previous right hip arthroplasty. EXAM: CT OF THE RIGHT HIP WITHOUT CONTRAST TECHNIQUE: Multidetector CT imaging of the right hip was performed according to the standard protocol. Multiplanar CT image reconstructions were also generated. COMPARISON:  Radiographs 05/26/2017.  CT 07/15/2016. FINDINGS: Bones/Joint/Cartilage Examination is limited to the inferior right hemipelvis and proximal right femur. Patient is status post right total hip arthroplasty. The hardware is intact without loosening. There is an acute periprosthetic fracture of the proximal right femur with mild displacement of the anterior cortex at the level of the lesser trochanter, best seen on sagittal images 16-18. There is probable superior extension of this fracture into the greater trochanter. No dislocation or significant joint effusion. Ligaments Not relevant for exam/indication. Muscles and Tendons The right hip periarticular muscles and tendons appear unremarkable aside from mild fatty atrophy in the gluteus musculature. Soft tissues Mild soft tissue stranding in the subcutaneous fat lateral to the right greater trochanter. No  focal hematoma. Femoral atherosclerosis noted. IMPRESSION: 1. Mildly displaced periprosthetic fracture of the proximal right femur. 2. No displacement the right total hip arthroplasty or dislocation. Electronically Signed   By: Carey Bullocks M.D.   On: 05/26/2017 20:45   Dg Hip Unilat W Or Wo Pelvis 2-3 Views Right  Result Date: 05/26/2017 CLINICAL DATA:  Right lateral hip pain after fall. EXAM: DG HIP (WITH OR WITHOUT PELVIS) 2-3V RIGHT COMPARISON:  CT pelvis dated July 15, 2016. FINDINGS: Prior right total hip arthroplasty. No evidence of hardware complication. No acute fracture or dislocation. The left hip joint space is preserved. The pubic symphysis and sacroiliac joints are intact. Diffuse osteopenia. Soft tissues are unremarkable. IMPRESSION: 1. Prior right total hip arthroplasty. No evidence of hardware complication or acute osseous abnormality. Electronically Signed   By: Obie Dredge M.D.   On: 05/26/2017 18:09    Procedures Procedures (including critical care time)  Medications Ordered in ED Medications  acetaminophen (TYLENOL) tablet 1,000 mg (1,000 mg Oral Given 05/26/17 1909)     Initial Impression / Assessment and Plan / ED Course  I have reviewed the triage vital signs and the nursing notes.  Pertinent labs & imaging results that were available during my care of the patient were reviewed by me and considered in my medical decision making (see chart for details).     I spoke with Dr. Lequita Halt who reviewed her CT scans and feels that she is able to be discharged home with mild weightbearing with a walker.  The patient states she also has a wheelchair that she can use as well I did advise her that she will need to call Dr. Darrelyn Hillock for a follow-up appointment.  The patient agrees the plan and all questions were answered.  Final Clinical Impressions(s) / ED Diagnoses   Final diagnoses:  None    ED Discharge Orders    None       Charlestine Night, PA-C 05/26/17  2214    Alvira Monday, MD 05/27/17 1213

## 2017-05-26 NOTE — ED Notes (Signed)
Pt walked with plus 2 assistance Pt dragging right foot  Still complains of pain with walking

## 2017-05-26 NOTE — ED Notes (Signed)
Bed: [facility] Expected date:  Expected time:  Means of arrival:  Comments: EMS-hip pain 

## 2017-05-26 NOTE — Discharge Instructions (Signed)
Make sure that you are using your walker whenever you are walking or your wheelchair.  Follow-up with Dr. Darrelyn HillockGioffre soon as possible.  You can also take Tylenol 1000 mg every 4 hours.

## 2017-05-27 DIAGNOSIS — M9701XA Periprosthetic fracture around internal prosthetic right hip joint, initial encounter: Secondary | ICD-10-CM | POA: Diagnosis not present

## 2017-06-01 ENCOUNTER — Emergency Department (HOSPITAL_COMMUNITY): Payer: PPO

## 2017-06-01 ENCOUNTER — Emergency Department (HOSPITAL_COMMUNITY)
Admission: EM | Admit: 2017-06-01 | Discharge: 2017-06-01 | Disposition: A | Discharge status: Home / Self Care | Payer: PPO | Attending: Emergency Medicine | Admitting: Emergency Medicine

## 2017-06-01 ENCOUNTER — Encounter (HOSPITAL_COMMUNITY): Payer: Self-pay

## 2017-06-01 DIAGNOSIS — W19XXXA Unspecified fall, initial encounter: Secondary | ICD-10-CM

## 2017-06-01 DIAGNOSIS — M25521 Pain in right elbow: Secondary | ICD-10-CM | POA: Insufficient documentation

## 2017-06-01 DIAGNOSIS — Y9301 Activity, walking, marching and hiking: Secondary | ICD-10-CM | POA: Diagnosis not present

## 2017-06-01 DIAGNOSIS — Y92012 Bathroom of single-family (private) house as the place of occurrence of the external cause: Secondary | ICD-10-CM | POA: Diagnosis not present

## 2017-06-01 DIAGNOSIS — Z96641 Presence of right artificial hip joint: Secondary | ICD-10-CM | POA: Diagnosis not present

## 2017-06-01 DIAGNOSIS — M79604 Pain in right leg: Secondary | ICD-10-CM | POA: Diagnosis not present

## 2017-06-01 DIAGNOSIS — G8911 Acute pain due to trauma: Secondary | ICD-10-CM | POA: Diagnosis not present

## 2017-06-01 DIAGNOSIS — N289 Disorder of kidney and ureter, unspecified: Secondary | ICD-10-CM | POA: Diagnosis not present

## 2017-06-01 DIAGNOSIS — M25551 Pain in right hip: Secondary | ICD-10-CM | POA: Diagnosis not present

## 2017-06-01 DIAGNOSIS — Z87891 Personal history of nicotine dependence: Secondary | ICD-10-CM | POA: Diagnosis not present

## 2017-06-01 DIAGNOSIS — S8991XA Unspecified injury of right lower leg, initial encounter: Secondary | ICD-10-CM | POA: Diagnosis not present

## 2017-06-01 DIAGNOSIS — F0390 Unspecified dementia without behavioral disturbance: Secondary | ICD-10-CM | POA: Insufficient documentation

## 2017-06-01 DIAGNOSIS — S0990XA Unspecified injury of head, initial encounter: Secondary | ICD-10-CM | POA: Diagnosis not present

## 2017-06-01 DIAGNOSIS — Y998 Other external cause status: Secondary | ICD-10-CM | POA: Diagnosis not present

## 2017-06-01 DIAGNOSIS — I10 Essential (primary) hypertension: Secondary | ICD-10-CM | POA: Insufficient documentation

## 2017-06-01 DIAGNOSIS — S59901A Unspecified injury of right elbow, initial encounter: Secondary | ICD-10-CM | POA: Diagnosis not present

## 2017-06-01 DIAGNOSIS — T148XXA Other injury of unspecified body region, initial encounter: Secondary | ICD-10-CM | POA: Diagnosis not present

## 2017-06-01 DIAGNOSIS — S79911A Unspecified injury of right hip, initial encounter: Secondary | ICD-10-CM | POA: Diagnosis not present

## 2017-06-01 DIAGNOSIS — S199XXA Unspecified injury of neck, initial encounter: Secondary | ICD-10-CM | POA: Diagnosis not present

## 2017-06-01 MED ORDER — METOPROLOL TARTRATE 25 MG PO TABS
12.5000 mg | ORAL_TABLET | Freq: Once | ORAL | Status: AC
  Administered 2017-06-01: 12.5 mg via ORAL
  Filled 2017-06-01: qty 1

## 2017-06-01 MED ORDER — HYDRALAZINE HCL 25 MG PO TABS
25.0000 mg | ORAL_TABLET | Freq: Once | ORAL | Status: AC
  Administered 2017-06-01: 25 mg via ORAL
  Filled 2017-06-01: qty 1

## 2017-06-01 NOTE — Care Management Note (Addendum)
Case Management Note  Patient Details  Name: Yvonne Beck MRN: [id number] Date of Birth: 05/06/1923  Subjective/Objective:          Presented to [facility] ED s/p fall         Action/Plan: [facility] ED CM received consult from [facility] ED secretary concerning [facility] needs. CM reviewed patient's record, CM spoke with granddaughter Narda Rutherford [phone] concerning recommendations for [facility] services. She reports patient lives alone and she has been coming from [facility] to stay with patient overnight but she feels that patient needs more assistance than what she can provide and is looking for options. CM explained that patient has been evaluated in the ED and at present does not met criteria for inpatient admission. But ED CM can assist with arranging [facility] services and also discussed [facility] services and how the patient may benefit from [facility] services as well. Granddaughter is agreeable with care transition plan. CM offered choice [facility] selected referral was called into [facility] liaison for [facility], and referral made to [facility]. Explained that nursing services may not be within 24 hours but an [facility] representative will contact her by phone 24 -48 hours post discharge, she verbalized understanding and teach back done. No further ED CM needs identified.   Expected Discharge Date: 06/01/17                 Expected Discharge Plan:  [place]  In-House Referral:  [facility]  Discharge planning Services  CM Consult  Post Acute Care Choice:  Home Health Choice offered to:   Granddaughter Narda Rutherford  DME Arranged:    DME Agency:     HH Arranged:  RN, PT, OT, Social Work, Nurse's Aide [place] Agency:  [place]  Status of Service:  Completed, signed off  If discussed at [employer], dates discussed:    Additional CommentsLaurena Slimmer, RN 06/01/2017, 1:06 PM

## 2017-06-01 NOTE — ED Provider Notes (Signed)
Emergency Department Provider Note   I have reviewed the triage vital signs and the nursing notes.   HISTORY  Chief Complaint Fall   HPI Yvonne Beck is a 84 y.o. female with PMH of HTN, Glaucoma, arthritis, and h/o right hip arthroplasty presents to the emergency department for evaluation after unwitnessed fall at home.  The patient states she was using her walker to get to the bathroom when she suddenly fell to the ground.  She denies any presyncope symptoms or chest pain prior to falling.  She reports that during fall she struck her head on the bathtub and was unable to make it to the toilet.  She complains of pain in the right hip/thigh.  Weakness in her arms or legs.  She does have a headache after striking her head on the bathtub.  No loss of consciousness. On my initial exam and interview the patient does not have family at bedside.   Level 5 caveat: Dementia.   10:25 AM The patient's granddaughter is now at bedside and reports that the patient has been transferring from the bed to bedside commode since ED discharge. They did see Dr. Juliene Pina the next day and have a follow up appointment in 2 weeks. She was told to "take it easy" which she has mostly been doing until today. Family state that she was likely not using her walker this AM because if was far away from where she fell. They also note some complaints about right elbow pain.   Past Medical History:  Diagnosis Date  . Anxiety   . Arthritis   . Glaucoma, both eyes   . Hypertension     Patient Active Problem List   Diagnosis Date Noted  . AKI (acute kidney injury) (HCC) 03/05/2017  . Acute kidney injury (HCC) 03/04/2017  . Near syncope 03/04/2017  . Acute anxiety 03/04/2017  . Uncontrolled hypertension 03/04/2017  . Postoperative anemia due to acute blood loss 02/11/2012  . Avascular necrosis of femoral head (HCC) 02/10/2012    Past Surgical History:  Procedure Laterality Date  . JOINT REPLACEMENT    . TOTAL  HIP ARTHROPLASTY  02/10/2012   Procedure: TOTAL HIP ARTHROPLASTY;  Surgeon: Jacki Cones, MD;  Location: [facility];  Service: Orthopedics;  Laterality: Right;    Current Outpatient Rx  . Order #: [id number] Class: OTC  . Order #: [id number] Class: Print  . Order #: [id number] Class: Normal  . Order #: [id number] Class: Historical Med  . Order #: [id number] Class: Normal  . Order #: [id number] Class: Historical Med  . Order #: [id number] Class: Print    Allergies Prednisone  History reviewed. No pertinent family history.  Social History Social History   Tobacco Use  . Smoking status: Former Games developer  . Smokeless tobacco: Never Used  Substance Use Topics  . Alcohol use: No  . Drug use: No    Review of Systems  Constitutional: No fever/chills Eyes: No visual changes. ENT: No sore throat. Cardiovascular: Denies chest pain. Respiratory: Denies shortness of breath. Gastrointestinal: No abdominal pain.  No nausea, no vomiting.  No diarrhea.  No constipation. Genitourinary: Negative for dysuria. Musculoskeletal: Negative for back pain. Positive right hip pain.  Skin: Negative for rash. Neurological: Negative for headaches, focal weakness or numbness.   ____________________________________________   PHYSICAL EXAM:  VITAL SIGNS: ED Triage Vitals  Enc Vitals Group     BP 06/01/17 0957 (!) 174/94     Pulse Rate 06/01/17 0957 71     Resp 06/01/17 0957 16  Temp 06/01/17 0957 98.5 F (36.9 C)     Temp Source 06/01/17 0957 Oral     SpO2 06/01/17 0957 95 %     Weight 06/01/17 0957 130 lb (59 kg)     Height 06/01/17 0957 5\' 5"  (1.651 m)     Pain Score 06/01/17 1009 4   Constitutional: Alert but confused (baseline). Well appearing and in no acute distress. Eyes: Conjunctivae are normal. PERRL.  Head: Atraumatic. Nose: No congestion/rhinnorhea. Mouth/Throat: Mucous membranes are moist.  Neck: No stridor. No cervical spine tenderness to palpation. Cardiovascular: Normal rate, regular  rhythm. Good peripheral circulation. Grossly normal heart sounds.   Respiratory: Normal respiratory effort.  No retractions. Lungs CTAB. Gastrointestinal: Soft and nontender. No distention.  Musculoskeletal: No tenderness to palpation of the right leg/thigh. Pain with passive ROM of the right hip. Normal knee/ankle pain or swelling. Normal ROM of the left hip. Normal ROM of the bilateral upper extremities but has some mild pain in the right elbow.  Neurologic:  Normal speech and language. No gross focal neurologic deficits are appreciated.  Skin:  Skin is warm, dry and intact. No rash noted.  ____________________________________________  RADIOLOGY  Dg Elbow 2 Views Right  Result Date: 06/01/2017 CLINICAL DATA:  Larey Seat in bathroom this morning.  Right elbow pain. EXAM: RIGHT ELBOW - 2 VIEW COMPARISON:  None. FINDINGS: No fracture.  No bone lesion. The elbow joint is normally spaced and aligned.  No joint effusion. Bones are demineralized. No radiopaque foreign bodies. IMPRESSION: No fracture or dislocation. Electronically Signed   By: Amie Portland M.D.   On: 06/01/2017 11:08   Ct Head Wo Contrast  Result Date: 06/01/2017 CLINICAL DATA:  Un witnessed fall this morning. EXAM: CT HEAD WITHOUT CONTRAST CT CERVICAL SPINE WITHOUT CONTRAST TECHNIQUE: Multidetector CT imaging of the head and cervical spine was performed following the standard protocol without intravenous contrast. Multiplanar CT image reconstructions of the cervical spine were also generated. COMPARISON:  Head CT, 03/04/2017 FINDINGS: CT HEAD FINDINGS Brain: No evidence of acute infarction, hemorrhage, hydrocephalus, extra-axial collection or mass lesion/mass effect. There is ventricular and sulcal enlargement reflecting moderate generalized atrophy. Periventricular white matter hypoattenuation is also noted consistent with mild chronic microvascular ischemic change. Vascular: No hyperdense vessel or unexpected calcification. Skull: Normal.  Negative for fracture or focal lesion. Sinuses/Orbits: Globes and orbits are unremarkable. There is dependent fluid in the right sphenoid sinus. Sinuses otherwise clear. Clear mastoid air cells. Other: None. CT CERVICAL SPINE FINDINGS Alignment: Normal. Skull base and vertebrae: No acute fracture. No primary bone lesion or focal pathologic process. Soft tissues and spinal canal: No prevertebral fluid or swelling. No visible canal hematoma. Disc levels: Marked loss of disc height at C3-C4. Moderate loss of disc height from C4-C5 through the C6-C7. Mild spondylotic disc bulging. No convincing disc herniation. Upper chest: Pleuroparenchymal scarring at the apices. No acute findings. Other: None. IMPRESSION: HEAD CT 1. No acute intracranial abnormalities. 2. No skull fracture. 3. Atrophy and chronic microvascular ischemic change. CERVICAL CT 1. No fracture or acute finding. Electronically Signed   By: Amie Portland M.D.   On: 06/01/2017 10:58   Ct Cervical Spine Wo Contrast  Result Date: 06/01/2017 CLINICAL DATA:  Un witnessed fall this morning. EXAM: CT HEAD WITHOUT CONTRAST CT CERVICAL SPINE WITHOUT CONTRAST TECHNIQUE: Multidetector CT imaging of the head and cervical spine was performed following the standard protocol without intravenous contrast. Multiplanar CT image reconstructions of the cervical spine were also generated. COMPARISON:  Head CT,  03/04/2017 FINDINGS: CT HEAD FINDINGS Brain: No evidence of acute infarction, hemorrhage, hydrocephalus, extra-axial collection or mass lesion/mass effect. There is ventricular and sulcal enlargement reflecting moderate generalized atrophy. Periventricular white matter hypoattenuation is also noted consistent with mild chronic microvascular ischemic change. Vascular: No hyperdense vessel or unexpected calcification. Skull: Normal. Negative for fracture or focal lesion. Sinuses/Orbits: Globes and orbits are unremarkable. There is dependent fluid in the right sphenoid  sinus. Sinuses otherwise clear. Clear mastoid air cells. Other: None. CT CERVICAL SPINE FINDINGS Alignment: Normal. Skull base and vertebrae: No acute fracture. No primary bone lesion or focal pathologic process. Soft tissues and spinal canal: No prevertebral fluid or swelling. No visible canal hematoma. Disc levels: Marked loss of disc height at C3-C4. Moderate loss of disc height from C4-C5 through the C6-C7. Mild spondylotic disc bulging. No convincing disc herniation. Upper chest: Pleuroparenchymal scarring at the apices. No acute findings. Other: None. IMPRESSION: HEAD CT 1. No acute intracranial abnormalities. 2. No skull fracture. 3. Atrophy and chronic microvascular ischemic change. CERVICAL CT 1. No fracture or acute finding. Electronically Signed   By: Amie Portlandavid  Ormond M.D.   On: 06/01/2017 10:58   Dg Hip Unilat W Or Wo Pelvis 2-3 Views Right  Result Date: 06/01/2017 CLINICAL DATA:  Patient fell this morning. Complaining of right hip pain. EXAM: DG HIP (WITH OR WITHOUT PELVIS) 2-3V RIGHT COMPARISON:  05/26/2017 FINDINGS: No fracture.  No bone lesion. Right total hip arthroplasty is well-seated and well-aligned with no evidence of loosening. SI joints, symphysis pubis and left hip joint are normally aligned. Bones are demineralized. IMPRESSION: No fracture, dislocation or evidence of loosening of the right hip arthroplasty. Electronically Signed   By: Amie Portlandavid  Ormond M.D.   On: 06/01/2017 11:06   Dg Femur 1 View Right  Result Date: 06/01/2017 CLINICAL DATA:  Larey SeatFell in bathroom this morning.  Right leg pain. EXAM: RIGHT FEMUR 1 VIEW COMPARISON:  None. FINDINGS: Single image of the proximal to distal right femur shows no evidence of a fracture. Intramedullary stem of the right total hip arthroplasty is visualized and appears well-seated. No bone lesions. Knee joint is normally aligned. Soft tissues are unremarkable. IMPRESSION: No fracture or acute finding. Electronically Signed   By: Amie Portlandavid  Ormond M.D.   On:  06/01/2017 11:06    ____________________________________________   PROCEDURES  Procedure(s) performed:   Procedures  None ____________________________________________   INITIAL IMPRESSION / ASSESSMENT AND PLAN / ED COURSE  Pertinent labs & imaging results that were available during my care of the patient were reviewed by me and considered in my medical decision making (see chart for details).  Patient presents to the emergency department for evaluation of fall at home.  She was using her walker and had an unwitnessed fall.  The history and exam is somewhat limited by dementia although the patient is able to provide a significant amount of history.  Waiting for family to arrive.  On my exam the patient has no obvious head trauma but reports striking her head against the bathtub when she fell.  In review of her home medications I do not see that she is on any anticoagulation.  She was evaluated in the emergency department after fall for right hip/thigh pain on 05/26/2017 and found to have a periprosthetic fracture of the right femur. She was supposed to f/u as an outpatient with ortho. The LEs is neurovascularly intact. Plan for CT imaging of the head, neck, and right hip/thigh.   01:03 PM Imaging reviewed in  detail with no acute fractures.  Suspect that she continues to have the small fracture of the humerus. She is following with ortho as an outpatient. Family is interested in temporary placement in rehab facility. Cannot place from the ED but they are open to home health with PT/OT there. Will f/u with PCP and outpatient social worker (provided at discharge) to arrange for placement from home. Daughter has wheelchair, bedside toilet, and walker.   At this time, I do not feel there is any life-threatening condition present. I have reviewed and discussed all results (EKG, imaging, lab, urine as appropriate), exam findings with patient. I have reviewed nursing notes and appropriate previous  records.  I feel the patient is safe to be discharged home without further emergent workup. Discussed usual and customary return precautions. Patient and family (if present) verbalize understanding and are comfortable with this plan.  Patient will follow-up with their primary care provider. If they do not have a primary care provider, information for follow-up has been provided to them. All questions have been answered.  ____________________________________________  FINAL CLINICAL IMPRESSION(S) / ED DIAGNOSES  Final diagnoses:  Fall, initial encounter  Right hip pain     MEDICATIONS GIVEN DURING THIS VISIT:  Medications  hydrALAZINE (APRESOLINE) tablet 25 mg (25 mg Oral Given 06/01/17 1153)  metoprolol tartrate (LOPRESSOR) tablet 12.5 mg (12.5 mg Oral Given 06/01/17 1153)   Note:  This document was prepared using Dragon voice recognition software and may include unintentional dictation errors.  Alona Bene, MD Emergency Medicine    Krystel Fletchall, Arlyss Repress, MD 06/01/17 [phone]

## 2017-06-01 NOTE — ED Notes (Signed)
Bed: [facility] Expected date:  Expected time:  Means of arrival:  Comments: EMS- 84 yo fall w/ hip pain

## 2017-06-01 NOTE — Discharge Instructions (Signed)

## 2017-06-01 NOTE — ED Triage Notes (Signed)
Patient arrived via [facility] from home. Patient had unwitnessed fall this morning. Patient using walker to go to bathroom. Lost balance and fail on right hip. Patient fractured right hip last week from a fall. Patient c/o of pain. Patient says she hit top of her head when she fail. Per ems they did not see anything. No LOC, no blood thinner. Patient does have blood from her nose. Hx. Of hypertension. Patient did not take her medications today. Patient is confused. Daughter says that is baseline. Patient unable to answer day, year, or weak. No other complaints. Daughter at bedside.

## 2017-06-03 ENCOUNTER — Other Ambulatory Visit: Payer: Self-pay

## 2017-06-03 DIAGNOSIS — F419 Anxiety disorder, unspecified: Secondary | ICD-10-CM | POA: Diagnosis not present

## 2017-06-03 DIAGNOSIS — Z96641 Presence of right artificial hip joint: Secondary | ICD-10-CM | POA: Diagnosis not present

## 2017-06-03 DIAGNOSIS — I10 Essential (primary) hypertension: Secondary | ICD-10-CM | POA: Diagnosis not present

## 2017-06-03 DIAGNOSIS — Z79891 Long term (current) use of opiate analgesic: Secondary | ICD-10-CM | POA: Diagnosis not present

## 2017-06-03 DIAGNOSIS — S81001D Unspecified open wound, right knee, subsequent encounter: Secondary | ICD-10-CM | POA: Diagnosis not present

## 2017-06-03 DIAGNOSIS — W19XXXD Unspecified fall, subsequent encounter: Secondary | ICD-10-CM | POA: Diagnosis not present

## 2017-06-03 DIAGNOSIS — M1991 Primary osteoarthritis, unspecified site: Secondary | ICD-10-CM | POA: Diagnosis not present

## 2017-06-03 DIAGNOSIS — M25551 Pain in right hip: Secondary | ICD-10-CM | POA: Diagnosis not present

## 2017-06-03 DIAGNOSIS — Z87891 Personal history of nicotine dependence: Secondary | ICD-10-CM | POA: Diagnosis not present

## 2017-06-03 NOTE — Patient Outreach (Addendum)
[facility]) Care Management  06/03/2017  Pixie CasinoBetty J Jeane [birth year] [id number]   Referral received from Emergency room on 06/02/17.   Assessment: 84 year old with history of falls, avascular necrosis of femoral head, HTN, acute kidney injury,   RNCM called to follow up. Spoke briefly with client's son, Billey GoslingCharlie. Mr. Lacy DuverneyKerns reports that his lives with client and has been taking care of her for about 14 years. Mr. Lacy DuverneyKerns states client has dementia and will not understand the phone conversation. [facility] making home visit. Two patient identifiers confirmed. RNCM discussed [facility].  Per Selinda Flavinheryl Foster, [facility] nurse, they are putting in place nursing, physical therapy, occupational therapy, bath aid and social work.  Plan: Unable to complete call initially. RNCM to call back.  06/03/17 3:15pm. RNCM returned call and spoke with client's son. He states client will be staying at home. He states that he has everything covered and family will be with client around the clock. He declines [facility] services at this time.   RNCM provided name and contact number and will send [facility] information packet.   Plan: RNCM will not open case.  Kathyrn SheriffJuana Paetyn Pietrzak, RN, MSN, [facility],CCM [facility] Community Care Coordinator Cell: [phone]

## 2017-06-10 DIAGNOSIS — M9701XA Periprosthetic fracture around internal prosthetic right hip joint, initial encounter: Secondary | ICD-10-CM | POA: Diagnosis not present

## 2017-06-18 ENCOUNTER — Encounter: Payer: Self-pay | Admitting: *Deleted

## 2017-06-18 NOTE — Addendum Note (Signed)
Addended by: Marlow BaarsELKINS, Angad Nabers L on: 06/18/2017 11:26 AM   Modules accepted: Level of Service, SmartSet

## 2017-06-18 NOTE — Telephone Encounter (Signed)
This encounter was created in error - please disregard.

## 2017-06-18 NOTE — Progress Notes (Signed)
This encounter was created in error - please disregard.

## 2017-06-23 ENCOUNTER — Other Ambulatory Visit: Payer: Self-pay | Admitting: *Deleted

## 2017-06-23 DIAGNOSIS — M6281 Muscle weakness (generalized): Secondary | ICD-10-CM | POA: Diagnosis not present

## 2017-06-23 DIAGNOSIS — H409 Unspecified glaucoma: Secondary | ICD-10-CM | POA: Diagnosis not present

## 2017-06-23 DIAGNOSIS — N179 Acute kidney failure, unspecified: Secondary | ICD-10-CM | POA: Diagnosis not present

## 2017-06-23 DIAGNOSIS — F419 Anxiety disorder, unspecified: Secondary | ICD-10-CM | POA: Diagnosis not present

## 2017-06-23 DIAGNOSIS — I10 Essential (primary) hypertension: Secondary | ICD-10-CM | POA: Diagnosis not present

## 2017-06-23 DIAGNOSIS — R2689 Other abnormalities of gait and mobility: Secondary | ICD-10-CM | POA: Diagnosis not present

## 2017-06-23 NOTE — Patient Outreach (Signed)
[facility] Care [facility]  06/23/2017  Yvonne CasinoBetty J Belanger 08-04-1923 [id number]   Ms. Yvonne Beck is a 84 year old female patient with medical history which includes falls, avascular necrosis of the femoral head, hypertension, dementia, and acute kidney injury.   A second referral received from the utilization review nurse from patient's health plan after her recent conversation with [name]'s son Billey GoslingCharlie who lives with her and has been her primary caregiver for the last 14 years. First referral was from the emergency department on 06/02/17. My colleague spoke with [name]'s son on 06/03/17.  I reached out again to [name]'s son Billey GoslingCharlie by phone but was unable to reach him. I left a HIPPA compliant message requesting a return call.   Home Care Services - [name] is being followed at home by [facility]. My colleague spoke with Selinda Flavinheryl Foster, [facility] nurse who stated that nursing, physical therapy, occupational therapy, bath aid and social work services were to be initiated for [name].   Plan: I will reach out to Mr. Lacy DuverneyKerns again by phone by the end of this week to follow up on any further needs.    [name] MHA,BSN,RN,CCM [facility] Care Management  [phone]

## 2017-06-26 ENCOUNTER — Other Ambulatory Visit: Payer: Self-pay | Admitting: *Deleted

## 2017-06-26 NOTE — Patient Outreach (Signed)
[facility]) Care Management  06/26/2017  Yvonne Beck Jan 19, 1924 [id number]  I was notified by [facility] CM Verdie DrownMary Niemczura RN that [facility] Beck [facility]. As such, Yvonne Beck will receive full and ongoing case management services and will not need [facility] CM Case Management services in addition.   Plan: I will close Yvonne Beck's case. I will send a letter to the home of her son to notify him of the case closure and to provide contact information for him should Yvonne Beck return home and be in need of case management services in the future.   Yvonne Beck MHA,BSN,RN,CCM [facility] Care Management  [phone]

## 2017-07-03 DIAGNOSIS — R2689 Other abnormalities of gait and mobility: Secondary | ICD-10-CM | POA: Diagnosis not present

## 2017-07-03 DIAGNOSIS — I10 Essential (primary) hypertension: Secondary | ICD-10-CM | POA: Diagnosis not present

## 2017-07-03 DIAGNOSIS — H409 Unspecified glaucoma: Secondary | ICD-10-CM | POA: Diagnosis not present

## 2017-07-03 DIAGNOSIS — M6281 Muscle weakness (generalized): Secondary | ICD-10-CM | POA: Diagnosis not present

## 2017-07-07 ENCOUNTER — Other Ambulatory Visit: Payer: Self-pay

## 2017-07-07 DIAGNOSIS — Z79891 Long term (current) use of opiate analgesic: Secondary | ICD-10-CM | POA: Diagnosis not present

## 2017-07-07 DIAGNOSIS — Z87891 Personal history of nicotine dependence: Secondary | ICD-10-CM | POA: Diagnosis not present

## 2017-07-07 DIAGNOSIS — H409 Unspecified glaucoma: Secondary | ICD-10-CM | POA: Diagnosis not present

## 2017-07-07 DIAGNOSIS — M9701XD Periprosthetic fracture around internal prosthetic right hip joint, subsequent encounter: Secondary | ICD-10-CM | POA: Diagnosis not present

## 2017-07-07 DIAGNOSIS — M1991 Primary osteoarthritis, unspecified site: Secondary | ICD-10-CM | POA: Diagnosis not present

## 2017-07-07 DIAGNOSIS — Z96641 Presence of right artificial hip joint: Secondary | ICD-10-CM | POA: Diagnosis not present

## 2017-07-07 DIAGNOSIS — I10 Essential (primary) hypertension: Secondary | ICD-10-CM | POA: Diagnosis not present

## 2017-07-07 DIAGNOSIS — W19XXXD Unspecified fall, subsequent encounter: Secondary | ICD-10-CM | POA: Diagnosis not present

## 2017-07-07 DIAGNOSIS — F0390 Unspecified dementia without behavioral disturbance: Secondary | ICD-10-CM | POA: Diagnosis not present

## 2017-07-07 DIAGNOSIS — F419 Anxiety disorder, unspecified: Secondary | ICD-10-CM | POA: Diagnosis not present

## 2017-07-07 NOTE — Patient Outreach (Signed)
[facility]) Care Management  07/07/2017  Yvonne Beck 1923/09/13 [id number]   Referral Date: 07/07/17  Referral Source: HTA Report Date of Admission:  Diagnosis: Falls Date of Discharge: 07/04/17 [facility] Insurance: HTA  Outreach attempt # 1 Spoke with son Yvonne Beck who is patient power of attorney.  He is able to verify HIPAA. He acknowledges [facility] and states that patient is doing so good since being home and that the home health nurse was surprised.  He states that home health is from [facility] and that the therapist is due to visit today.    Social: Patient lives in the home with her family with 24 hour support.    Conditions: Patient has a history of falls, dementia, and HTN.  Son reports patient is doing well for her age.  Medications: Son unable to review medications.  He states that his daughter is in charge of those and she is not present to review.  Appointments: Patient has an appointment with the Dr. Darrelyn Beck for follow up. No problems with transportation to appointments.    Consent: RN CM reviewed Yvonne Beck services with son.  He declined any needs presently.     Plan: RN CM will close case at this time.    Yvonne Lericheionne J Australia Droll, RN, MSN [facility] Care Management Care Management Coordinator Direct Line [phone] Toll Free: [phone]  Fax: [phone]

## 2017-07-10 DIAGNOSIS — I10 Essential (primary) hypertension: Secondary | ICD-10-CM | POA: Diagnosis not present

## 2017-07-10 DIAGNOSIS — D509 Iron deficiency anemia, unspecified: Secondary | ICD-10-CM | POA: Diagnosis not present

## 2017-07-14 DIAGNOSIS — M9701XA Periprosthetic fracture around internal prosthetic right hip joint, initial encounter: Secondary | ICD-10-CM | POA: Diagnosis not present

## 2017-08-07 DIAGNOSIS — W19XXXD Unspecified fall, subsequent encounter: Secondary | ICD-10-CM | POA: Diagnosis not present

## 2017-08-07 DIAGNOSIS — Z96641 Presence of right artificial hip joint: Secondary | ICD-10-CM | POA: Diagnosis not present

## 2017-08-07 DIAGNOSIS — M9701XD Periprosthetic fracture around internal prosthetic right hip joint, subsequent encounter: Secondary | ICD-10-CM | POA: Diagnosis not present

## 2017-08-07 DIAGNOSIS — Z79891 Long term (current) use of opiate analgesic: Secondary | ICD-10-CM | POA: Diagnosis not present

## 2017-08-07 DIAGNOSIS — M1991 Primary osteoarthritis, unspecified site: Secondary | ICD-10-CM | POA: Diagnosis not present

## 2017-08-07 DIAGNOSIS — I10 Essential (primary) hypertension: Secondary | ICD-10-CM | POA: Diagnosis not present

## 2017-08-07 DIAGNOSIS — F0390 Unspecified dementia without behavioral disturbance: Secondary | ICD-10-CM | POA: Diagnosis not present

## 2017-08-07 DIAGNOSIS — Z87891 Personal history of nicotine dependence: Secondary | ICD-10-CM | POA: Diagnosis not present

## 2017-08-07 DIAGNOSIS — F419 Anxiety disorder, unspecified: Secondary | ICD-10-CM | POA: Diagnosis not present

## 2017-08-07 DIAGNOSIS — H409 Unspecified glaucoma: Secondary | ICD-10-CM | POA: Diagnosis not present

## 2017-10-14 DIAGNOSIS — I10 Essential (primary) hypertension: Secondary | ICD-10-CM | POA: Diagnosis not present

## 2017-10-14 DIAGNOSIS — D649 Anemia, unspecified: Secondary | ICD-10-CM | POA: Diagnosis not present

## 2018-02-18 DIAGNOSIS — Z961 Presence of intraocular lens: Secondary | ICD-10-CM | POA: Diagnosis not present

## 2018-02-18 DIAGNOSIS — H401132 Primary open-angle glaucoma, bilateral, moderate stage: Secondary | ICD-10-CM | POA: Diagnosis not present

## 2018-03-25 ENCOUNTER — Emergency Department (HOSPITAL_COMMUNITY): Payer: PPO

## 2018-03-25 ENCOUNTER — Inpatient Hospital Stay (HOSPITAL_COMMUNITY)
Admission: EM | Admit: 2018-03-25 | Discharge: 2018-03-27 | DRG: 689 | Disposition: A | Discharge status: Home Health | Payer: PPO | Attending: Internal Medicine | Admitting: Internal Medicine

## 2018-03-25 ENCOUNTER — Encounter (HOSPITAL_COMMUNITY): Payer: Self-pay | Admitting: Emergency Medicine

## 2018-03-25 DIAGNOSIS — Z96641 Presence of right artificial hip joint: Secondary | ICD-10-CM | POA: Diagnosis not present

## 2018-03-25 DIAGNOSIS — E860 Dehydration: Secondary | ICD-10-CM | POA: Diagnosis present

## 2018-03-25 DIAGNOSIS — N179 Acute kidney failure, unspecified: Secondary | ICD-10-CM

## 2018-03-25 DIAGNOSIS — I129 Hypertensive chronic kidney disease with stage 1 through stage 4 chronic kidney disease, or unspecified chronic kidney disease: Secondary | ICD-10-CM | POA: Diagnosis present

## 2018-03-25 DIAGNOSIS — W19XXXA Unspecified fall, initial encounter: Secondary | ICD-10-CM

## 2018-03-25 DIAGNOSIS — F0391 Unspecified dementia with behavioral disturbance: Secondary | ICD-10-CM | POA: Diagnosis not present

## 2018-03-25 DIAGNOSIS — Z87891 Personal history of nicotine dependence: Secondary | ICD-10-CM | POA: Diagnosis not present

## 2018-03-25 DIAGNOSIS — I10 Essential (primary) hypertension: Secondary | ICD-10-CM

## 2018-03-25 DIAGNOSIS — Z981 Arthrodesis status: Secondary | ICD-10-CM

## 2018-03-25 DIAGNOSIS — N3000 Acute cystitis without hematuria: Secondary | ICD-10-CM | POA: Diagnosis not present

## 2018-03-25 DIAGNOSIS — N183 Chronic kidney disease, stage 3 unspecified: Secondary | ICD-10-CM | POA: Diagnosis present

## 2018-03-25 DIAGNOSIS — G9341 Metabolic encephalopathy: Secondary | ICD-10-CM | POA: Diagnosis not present

## 2018-03-25 DIAGNOSIS — Z888 Allergy status to other drugs, medicaments and biological substances status: Secondary | ICD-10-CM

## 2018-03-25 DIAGNOSIS — D509 Iron deficiency anemia, unspecified: Secondary | ICD-10-CM | POA: Diagnosis present

## 2018-03-25 DIAGNOSIS — I4891 Unspecified atrial fibrillation: Secondary | ICD-10-CM | POA: Diagnosis not present

## 2018-03-25 DIAGNOSIS — M542 Cervicalgia: Secondary | ICD-10-CM | POA: Diagnosis not present

## 2018-03-25 DIAGNOSIS — S299XXA Unspecified injury of thorax, initial encounter: Secondary | ICD-10-CM | POA: Diagnosis not present

## 2018-03-25 DIAGNOSIS — N390 Urinary tract infection, site not specified: Secondary | ICD-10-CM | POA: Diagnosis not present

## 2018-03-25 DIAGNOSIS — H409 Unspecified glaucoma: Secondary | ICD-10-CM | POA: Diagnosis present

## 2018-03-25 DIAGNOSIS — R413 Other amnesia: Secondary | ICD-10-CM | POA: Diagnosis not present

## 2018-03-25 DIAGNOSIS — Z9181 History of falling: Secondary | ICD-10-CM | POA: Diagnosis not present

## 2018-03-25 DIAGNOSIS — S0990XA Unspecified injury of head, initial encounter: Secondary | ICD-10-CM | POA: Diagnosis not present

## 2018-03-25 DIAGNOSIS — S199XXA Unspecified injury of neck, initial encounter: Secondary | ICD-10-CM | POA: Diagnosis not present

## 2018-03-25 DIAGNOSIS — Z79899 Other long term (current) drug therapy: Secondary | ICD-10-CM | POA: Diagnosis not present

## 2018-03-25 DIAGNOSIS — J449 Chronic obstructive pulmonary disease, unspecified: Secondary | ICD-10-CM | POA: Diagnosis present

## 2018-03-25 DIAGNOSIS — N178 Other acute kidney failure: Secondary | ICD-10-CM | POA: Diagnosis not present

## 2018-03-25 LAB — URINALYSIS, ROUTINE W REFLEX MICROSCOPIC
Bilirubin Urine: NEGATIVE
Glucose, UA: NEGATIVE mg/dL
Hgb urine dipstick: NEGATIVE
KETONES UR: NEGATIVE mg/dL
NITRITE: NEGATIVE
PH: 5 (ref 5.0–8.0)
Protein, ur: 100 mg/dL — AB
Specific Gravity, Urine: 1.008 (ref 1.005–1.030)

## 2018-03-25 LAB — COMPREHENSIVE METABOLIC PANEL
ALK PHOS: 82 U/L (ref 38–126)
ALT: 9 U/L (ref 0–44)
ANION GAP: 13 (ref 5–15)
AST: 18 U/L (ref 15–41)
Albumin: 3.6 g/dL (ref 3.5–5.0)
BUN: 31 mg/dL — ABNORMAL HIGH (ref 8–23)
CALCIUM: 9.1 mg/dL (ref 8.9–10.3)
CO2: 23 mmol/L (ref 22–32)
Chloride: 100 mmol/L (ref 98–111)
Creatinine, Ser: 1.75 mg/dL — ABNORMAL HIGH (ref 0.44–1.00)
GFR, EST AFRICAN AMERICAN: 29 mL/min — AB (ref >60)
GFR, EST NON AFRICAN AMERICAN: 25 mL/min — AB (ref >60)
GLUCOSE: 93 mg/dL (ref 70–99)
Potassium: 4.9 mmol/L (ref 3.5–5.1)
Sodium: 136 mmol/L (ref 135–145)
TOTAL PROTEIN: 7.2 g/dL (ref 6.5–8.1)
Total Bilirubin: 0.7 mg/dL (ref 0.3–1.2)

## 2018-03-25 LAB — CBC
HEMATOCRIT: 42.1 % (ref 36.0–46.0)
HEMOGLOBIN: 13 g/dL (ref 12.0–15.0)
MCH: 31.4 pg (ref 26.0–34.0)
MCHC: 30.9 g/dL (ref 30.0–36.0)
MCV: 101.7 fL — AB (ref 80.0–100.0)
Platelets: 223 10*3/uL (ref 150–400)
RBC: 4.14 MIL/uL (ref 3.87–5.11)
RDW: 13 % (ref 11.5–15.5)
WBC: 7.3 10*3/uL (ref 4.0–10.5)
nRBC: 0 % (ref 0.0–0.2)

## 2018-03-25 LAB — TROPONIN I: Troponin I: <0.03 ng/mL (ref <0.03)

## 2018-03-25 MED ORDER — ONDANSETRON HCL 4 MG/2ML IJ SOLN: 4.0000 mg | Freq: Four times a day (QID) | INTRAMUSCULAR | Status: DC | PRN

## 2018-03-25 MED ORDER — ENOXAPARIN SODIUM 30 MG/0.3ML ~~LOC~~ SOLN
30.0000 mg | SUBCUTANEOUS | Status: DC
  Filled 2018-03-25: qty 0.3

## 2018-03-25 MED ORDER — SODIUM CHLORIDE 0.9 % IV SOLN
INTRAVENOUS | Status: DC
  Administered 2018-03-25 – 2018-03-27 (×3): via INTRAVENOUS

## 2018-03-25 MED ORDER — SODIUM CHLORIDE 0.9 % IV BOLUS
1000.0000 mL | Freq: Once | INTRAVENOUS | Status: AC
  Administered 2018-03-25: 1000 mL via INTRAVENOUS

## 2018-03-25 MED ORDER — ONDANSETRON HCL 4 MG PO TABS: 4.0000 mg | ORAL_TABLET | Freq: Four times a day (QID) | ORAL | Status: DC | PRN

## 2018-03-25 MED ORDER — LATANOPROST 0.005 % OP SOLN
1.0000 [drp] | Freq: Every morning | OPHTHALMIC | Status: DC
  Administered 2018-03-26 – 2018-03-27 (×2): 1 [drp] via OPHTHALMIC
  Filled 2018-03-25: qty 2.5

## 2018-03-25 MED ORDER — ZOLPIDEM TARTRATE 5 MG PO TABS: 5.0000 mg | ORAL_TABLET | Freq: Every evening | ORAL | Status: DC | PRN

## 2018-03-25 MED ORDER — ACETAMINOPHEN 650 MG RE SUPP: 650.0000 mg | Freq: Four times a day (QID) | RECTAL | Status: DC | PRN

## 2018-03-25 MED ORDER — SODIUM CHLORIDE 0.9 % IV SOLN
1.0000 g | Freq: Once | INTRAVENOUS | Status: AC
  Administered 2018-03-25: 1 g via INTRAVENOUS
  Filled 2018-03-25: qty 10

## 2018-03-25 MED ORDER — ENOXAPARIN SODIUM 40 MG/0.4ML ~~LOC~~ SOLN
40.0000 mg | SUBCUTANEOUS | Status: DC
  Administered 2018-03-25: 40 mg via SUBCUTANEOUS
  Filled 2018-03-25: qty 0.4

## 2018-03-25 MED ORDER — SODIUM CHLORIDE 0.9 % IV SOLN
1.0000 g | INTRAVENOUS | Status: DC
  Administered 2018-03-26: 1 g via INTRAVENOUS
  Filled 2018-03-25: qty 10

## 2018-03-25 MED ORDER — ACETAMINOPHEN 325 MG PO TABS: 650.0000 mg | ORAL_TABLET | Freq: Four times a day (QID) | ORAL | Status: DC | PRN

## 2018-03-25 MED ORDER — POLYETHYLENE GLYCOL 3350 17 G PO PACK: 17.0000 g | PACK | Freq: Every day | ORAL | Status: DC | PRN

## 2018-03-25 MED ORDER — METOPROLOL TARTRATE 25 MG PO TABS
12.5000 mg | ORAL_TABLET | Freq: Two times a day (BID) | ORAL | Status: DC
  Administered 2018-03-25 – 2018-03-27 (×3): 12.5 mg via ORAL
  Filled 2018-03-25 (×4): qty 1

## 2018-03-25 MED ORDER — HYDRALAZINE HCL 20 MG/ML IJ SOLN
5.0000 mg | INTRAMUSCULAR | Status: DC | PRN
  Administered 2018-03-26: 5 mg via INTRAVENOUS
  Filled 2018-03-25: qty 1

## 2018-03-25 MED ORDER — TIMOLOL MALEATE 0.25 % OP SOLG
1.0000 [drp] | Freq: Every morning | OPHTHALMIC | Status: DC
  Administered 2018-03-26 – 2018-03-27 (×2): 1 [drp] via OPHTHALMIC
  Filled 2018-03-25: qty 5

## 2018-03-25 MED ORDER — TRAMADOL HCL 50 MG PO TABS
50.0000 mg | ORAL_TABLET | Freq: Four times a day (QID) | ORAL | Status: DC | PRN
  Administered 2018-03-25: 50 mg via ORAL
  Filled 2018-03-25: qty 1

## 2018-03-25 MED ORDER — FERROUS SULFATE 325 (65 FE) MG PO TABS
325.0000 mg | ORAL_TABLET | Freq: Two times a day (BID) | ORAL | Status: DC
  Administered 2018-03-26 – 2018-03-27 (×3): 325 mg via ORAL
  Filled 2018-03-25 (×3): qty 1

## 2018-03-25 MED ORDER — HYDRALAZINE HCL 25 MG PO TABS
25.0000 mg | ORAL_TABLET | Freq: Three times a day (TID) | ORAL | Status: DC
  Administered 2018-03-25 – 2018-03-27 (×5): 25 mg via ORAL
  Filled 2018-03-25 (×6): qty 1

## 2018-03-25 NOTE — ED Notes (Signed)
Attempted to complete in and out cath. Grand daughter of patient refused saying her grandmother can pee without it. Placed pt on bed pan and pt states she is unable to pee. MD notified of situation
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
## 2018-03-25 NOTE — ED Notes (Signed)
Patient transported to CT 

## 2018-03-25 NOTE — ED Notes (Signed)
Pt grand daughter number is [phone]

## 2018-03-25 NOTE — ED Triage Notes (Signed)
Family reports pt has not been wanting to get out of bed for 4 days, extreme weakness. Pt fell 4 days ago. Endorses some dizziness and back pain. Family at bedside states she has been seeing people in her house that are not there.

## 2018-03-25 NOTE — ED Notes (Signed)
Informed pt we needed urine sample. Pt states she is unable to pee at this time

## 2018-03-25 NOTE — ED Provider Notes (Signed)
[facility] Provider Note MRN:  [id number]  Arrival date & time: 03/26/18     Chief Complaint   Weakness and Fall   History of Present Illness   Yvonne Beck is a 84 y.o. year-old female with a history of hypertension presenting to the ED with chief complaint of weakness and fall.  Patient explains that she lost her balance today and fell backwards.  Unsure if she hit her head, no loss of consciousness, endorsing neck pain and mid back pain.  Denies chest pain or shortness of breath.  No abdominal pain, no pain to the arms or legs.  Daughter explains that she has been feeling generally unwell, not getting out of bed for the past 4 to 5 days.  Denies fever, no cough, no cold-like symptoms.  No exacerbating or alleviating factors.  Review of Systems  A complete 10 system review of systems was obtained and all systems are negative except as noted in the HPI and PMH.   Patient's Health History    Past Medical History:  Diagnosis Date  . Anxiety   . Arthritis   . Glaucoma, both eyes   . Hypertension     Past Surgical History:  Procedure Laterality Date  . JOINT REPLACEMENT    . TOTAL HIP ARTHROPLASTY  02/10/2012   Procedure: TOTAL HIP ARTHROPLASTY;  Surgeon: Jacki Conesonald A Gioffre, MD;  Location: [facility];  Service: Orthopedics;  Laterality: Right;    Family History  Problem Relation Age of Onset  . Stroke Sister     Social History   Socioeconomic History  . Marital status: Widowed    Spouse name: Not on file  . Number of children: Not on file  . Years of education: Not on file  . Highest education level: Not on file  Occupational History  . Not on file  Social Needs  . Financial resource strain: Not on file  . Food insecurity:    Worry: Not on file    Inability: Not on file  . Transportation needs:    Medical: Not on file    Non-medical: Not on file  Tobacco Use  . Smoking status: Former Games developermoker  . Smokeless tobacco: Never Used    Substance and Sexual Activity  . Alcohol use: No  . Drug use: No  . Sexual activity: Not on file  Lifestyle  . Physical activity:    Days per week: Not on file    Minutes per session: Not on file  . Stress: Not on file  Relationships  . Social connections:    Talks on phone: Not on file    Gets together: Not on file    Attends religious service: Not on file    Active member of club or organization: Not on file    Attends meetings of clubs or organizations: Not on file    Relationship status: Not on file  . Intimate partner violence:    Fear of current or ex partner: Not on file    Emotionally abused: Not on file    Physically abused: Not on file    Forced sexual activity: Not on file  Other Topics Concern  . Not on file  Social History Narrative  . Not on file     Physical Exam  Vital Signs and Nursing Notes reviewed Vitals:   03/25/18 1930 03/25/18 2107  BP:  (!) 178/78  Pulse: (!) 59 61  Resp: 16 20  Temp:  97.7 F (36.5  C)  SpO2: 100% 97%    CONSTITUTIONAL: Well-appearing, NAD NEURO:  Alert and oriented x 3, no focal deficits EYES:  eyes equal and reactive ENT/NECK:  no LAD, no JVD CARDIO: Regular rate, well-perfused, normal S1 and S2 PULM:  CTAB no wheezing or rhonchi GI/GU:  normal bowel sounds, non-distended, non-tender MSK/SPINE:  No gross deformities, no edema, mild midline tenderness palpation to the cervical spine, right paraspinal tenderness to palpation to the thoracic spine. SKIN:  no rash, atraumatic PSYCH:  Appropriate speech and behavior  Diagnostic and Interventional Summary    EKG Interpretation  Date/Time:  Wednesday March 25 2018 15:58:04 EST Ventricular Rate:  59 PR Interval:    QRS Duration: 99 QT Interval:  450 QTC Calculation: 446 R Axis:   73 Text Interpretation:  Atrial fibrillation Anteroseptal infarct, age indeterminate Baseline wander in lead(s) II III aVR aVF V1 Confirmed by Kennis CarinaBero, Lizbeth Feijoo [phone]([id number]) on 03/25/2018 4:01:59  PM      Labs Reviewed  CBC - Abnormal; Notable for the following components:      Result Value   MCV 101.7 (*)    All other components within normal limits  COMPREHENSIVE METABOLIC PANEL - Abnormal; Notable for the following components:   BUN 31 (*)    Creatinine, Ser 1.75 (*)    GFR calc non Af Amer 25 (*)    GFR calc Af Amer 29 (*)    All other components within normal limits  URINALYSIS, ROUTINE W REFLEX MICROSCOPIC - Abnormal; Notable for the following components:   APPearance HAZY (*)    Protein, ur 100 (*)    Leukocytes, UA LARGE (*)    Bacteria, UA RARE (*)    Non Squamous Epithelial 0-5 (*)    All other components within normal limits  CULTURE, BLOOD (ROUTINE X 2)  CULTURE, BLOOD (ROUTINE X 2)  URINE CULTURE  TROPONIN I    DG Chest 2 View  Final Result    CT HEAD WO CONTRAST  Final Result    CT Cervical Spine Wo Contrast  Final Result      Medications  cefTRIAXone (ROCEPHIN) 1 g in sodium chloride 0.9 % 100 mL IVPB (has no administration in time range)  acetaminophen (TYLENOL) tablet 650 mg (has no administration in time range)    Or  acetaminophen (TYLENOL) suppository 650 mg (has no administration in time range)  ondansetron (ZOFRAN) tablet 4 mg (has no administration in time range)    Or  ondansetron (ZOFRAN) injection 4 mg (has no administration in time range)  polyethylene glycol (MIRALAX / GLYCOLAX) packet 17 g (has no administration in time range)  hydrALAZINE (APRESOLINE) injection 5 mg (has no administration in time range)  zolpidem (AMBIEN) tablet 5 mg (has no administration in time range)  0.9 %  sodium chloride infusion ( Intravenous New Bag/Given 03/25/18 2118)  traMADol (ULTRAM) tablet 50 mg (50 mg Oral Given 03/25/18 2116)  hydrALAZINE (APRESOLINE) tablet 25 mg (25 mg Oral Given 03/25/18 2117)  metoprolol tartrate (LOPRESSOR) tablet 12.5 mg (12.5 mg Oral Given 03/25/18 2116)  ferrous sulfate tablet 325 mg (has no administration in time range)   latanoprost (XALATAN) 0.005 % ophthalmic solution 1 drop (has no administration in time range)  timolol (TIMOPTIC-XR) 0.25 % ophthalmic gel-forming 1 drop (has no administration in time range)  enoxaparin (LOVENOX) injection 30 mg (has no administration in time range)  cefTRIAXone (ROCEPHIN) 1 g in sodium chloride 0.9 % 100 mL IVPB (0 g Intravenous Stopped 03/25/18 2005)  sodium  chloride 0.9 % bolus 1,000 mL (1,000 mLs Intravenous New Bag/Given 03/25/18 1931)     Procedures Critical Care  ED Course and Medical Decision Making  I have reviewed the triage vital signs and the nursing notes.  Pertinent labs & imaging results that were available during my care of the patient were reviewed by me and considered in my medical decision making (see below for details).  Patient is largely nontraumatic on exam, low concern for significant traumatic injury.  More concerning would be the patient's functional decline over the past 4 to 5 days, will require laboratory assessment, urinalysis.  Considering metabolic disarray, UTI, CNS pathology.  Evaluation reveals likely UTI with mild AKI.  Admitted to hospital service for further care and evaluation.  Elmer Sow. Pilar Plate, MD [facility] Emergency Medicine [facility] [email or web address]  Final Clinical Impressions(s) / ED Diagnoses     ICD-10-CM   1. Lower urinary tract infectious disease N39.0   2. Fall W19.Lorne Skeens DG Chest 2 View    DG Chest 2 View  3. AKI (acute kidney injury) [facility]) N17.9     ED Discharge Orders    None         Sabas Sous, MD 03/26/18 [phone]

## 2018-03-26 DIAGNOSIS — J449 Chronic obstructive pulmonary disease, unspecified: Secondary | ICD-10-CM | POA: Diagnosis present

## 2018-03-26 DIAGNOSIS — N179 Acute kidney failure, unspecified: Secondary | ICD-10-CM | POA: Diagnosis present

## 2018-03-26 DIAGNOSIS — Z888 Allergy status to other drugs, medicaments and biological substances status: Secondary | ICD-10-CM | POA: Diagnosis not present

## 2018-03-26 DIAGNOSIS — Z981 Arthrodesis status: Secondary | ICD-10-CM | POA: Diagnosis not present

## 2018-03-26 DIAGNOSIS — Z9181 History of falling: Secondary | ICD-10-CM | POA: Diagnosis not present

## 2018-03-26 DIAGNOSIS — E860 Dehydration: Secondary | ICD-10-CM | POA: Diagnosis present

## 2018-03-26 DIAGNOSIS — N178 Other acute kidney failure: Secondary | ICD-10-CM

## 2018-03-26 DIAGNOSIS — H409 Unspecified glaucoma: Secondary | ICD-10-CM | POA: Diagnosis present

## 2018-03-26 DIAGNOSIS — I129 Hypertensive chronic kidney disease with stage 1 through stage 4 chronic kidney disease, or unspecified chronic kidney disease: Secondary | ICD-10-CM | POA: Diagnosis present

## 2018-03-26 DIAGNOSIS — N390 Urinary tract infection, site not specified: Principal | ICD-10-CM

## 2018-03-26 DIAGNOSIS — Z96641 Presence of right artificial hip joint: Secondary | ICD-10-CM | POA: Diagnosis present

## 2018-03-26 DIAGNOSIS — Z79899 Other long term (current) drug therapy: Secondary | ICD-10-CM | POA: Diagnosis not present

## 2018-03-26 DIAGNOSIS — G9341 Metabolic encephalopathy: Secondary | ICD-10-CM | POA: Diagnosis present

## 2018-03-26 DIAGNOSIS — R413 Other amnesia: Secondary | ICD-10-CM

## 2018-03-26 DIAGNOSIS — D509 Iron deficiency anemia, unspecified: Secondary | ICD-10-CM | POA: Diagnosis present

## 2018-03-26 DIAGNOSIS — F0391 Unspecified dementia with behavioral disturbance: Secondary | ICD-10-CM | POA: Diagnosis present

## 2018-03-26 DIAGNOSIS — N183 Chronic kidney disease, stage 3 (moderate): Secondary | ICD-10-CM | POA: Diagnosis present

## 2018-03-26 DIAGNOSIS — Z87891 Personal history of nicotine dependence: Secondary | ICD-10-CM | POA: Diagnosis not present

## 2018-03-26 LAB — BASIC METABOLIC PANEL
Anion gap: 10 (ref 5–15)
BUN: 25 mg/dL — ABNORMAL HIGH (ref 8–23)
CO2: 24 mmol/L (ref 22–32)
Calcium: 9.1 mg/dL (ref 8.9–10.3)
Chloride: 106 mmol/L (ref 98–111)
Creatinine, Ser: 1.69 mg/dL — ABNORMAL HIGH (ref 0.44–1.00)
GFR calc Af Amer: 30 mL/min — ABNORMAL LOW (ref >60)
GFR calc non Af Amer: 26 mL/min — ABNORMAL LOW (ref >60)
GLUCOSE: 129 mg/dL — AB (ref 70–99)
Potassium: 4.5 mmol/L (ref 3.5–5.1)
Sodium: 140 mmol/L (ref 135–145)

## 2018-03-26 MED ORDER — LORAZEPAM 2 MG/ML IJ SOLN
0.5000 mg | Freq: Once | INTRAMUSCULAR | Status: DC
  Filled 2018-03-26: qty 1

## 2018-03-26 NOTE — Evaluation (Signed)
Physical Therapy Evaluation Patient Details Name: Yvonne Beck MRN: [id number] DOB: 08-Nov-1923 Today's Date: 03/26/2018   History of Present Illness  Patient is a 84 y/o female who presents with weakness, AMS and hallucinations. CXR-COPD. Admitted with UTI and AKI. PMH includes dementia, HTN, Rt THA in 2013, glaucoma, anxiety.   Clinical Impression  Patient presents with generalized weakness, impaired balance, baseline cognitive deficits, incontinence and impaired mobility s/p above. Tolerated multiple standing bouts with Min guard-Min A for balance/safety. Mobility limited due to incontinence with all movement. Recommend purewick at this time. Tech notified. Pt lives with son and granddaughter and gets help with ADLs as needed at baseline and uses RW for ambulation. Reports multiple falls at home. Will follow acutely to maximize independence and mobility prior to return home.    Follow Up Recommendations Home health PT;Supervision for mobility/OOB    Equipment Recommendations  None recommended by PT    Recommendations for Other Services       Precautions / Restrictions Precautions Precautions: Fall Restrictions Weight Bearing Restrictions: No      Mobility  Bed Mobility               General bed mobility comments: Sitting EOB upon PT arrival.   Transfers Overall transfer level: Needs assistance Equipment used: Rolling walker (2 wheeled) Transfers: Sit to/from Stand Sit to Stand: Min guard;Min assist         General transfer comment: Min A to steady in standing, stood from EOB x3, [facility], transferred to chair post ambulation.  Ambulation/Gait Ambulation/Gait assistance: Min assist Gait Distance (Feet): 6 Feet Assistive device: Rolling walker (2 wheeled) Gait Pattern/deviations: Step-through pattern;Decreased stride length;Trunk flexed Gait velocity: decreased   General Gait Details: Slow, mildly unsteady gait, limited due to incontinent of urine each time  with standing/attempted walking.  Stairs            Wheelchair Mobility    Modified Rankin (Stroke Patients Only)       Balance Overall balance assessment: Needs assistance;History of Falls Sitting-balance support: Feet supported;No upper extremity supported Sitting balance-Leahy Scale: Fair Sitting balance - Comments: Able to perform pericare with close Min guard.   Standing balance support: During functional activity;Bilateral upper extremity supported Standing balance-Leahy Scale: Poor Standing balance comment: Reliant on UEs and externnal support for standing balance.                             Pertinent Vitals/Pain Pain Assessment: Faces Faces Pain Scale: Hurts little more Pain Location: back Pain Descriptors / Indicators: Aching;Sore Pain Intervention(s): Monitored during session;Repositioned    Home Living Family/patient expects to be discharged to:: Private residence Living Arrangements: Children;Other relatives(granddaughter; son) Available Help at Discharge: Family;Available 24 hours/day Type of Home: House Home Access: Stairs to enter Entrance Stairs-Rails: Doctor, general practiceight;Left Entrance Stairs-Number of Steps: a few Home Layout: One level Home Equipment: Walker - 2 wheels;Shower seat      Prior Function Level of Independence: Needs assistance   Gait / Transfers Assistance Needed: Uses RW for ambulation. Reports falls.  ADL's / Homemaking Assistance Needed: Does ADLs mostly on own but has granddaughter help as needed.         Hand Dominance   Dominant Hand: Right    Extremity/Trunk Assessment   Upper Extremity Assessment Upper Extremity Assessment: Defer to OT evaluation    Lower Extremity Assessment Lower Extremity Assessment: Generalized weakness    Cervical / Trunk Assessment Cervical /  Trunk Assessment: Kyphotic  Communication   Communication: No difficulties  Cognition Arousal/Alertness: Awake/alert Behavior During Therapy:  WFL for tasks assessed/performed Overall Cognitive Status: History of cognitive impairments - at baseline                                 General Comments: A&Ox3. Pleasant. Repeating self a lot.      General Comments General comments (skin integrity, edema, etc.): Incontient of urine upon standing x3. Recommend purewick.    Exercises     Assessment/Plan    PT Assessment Patient needs continued PT services  PT Problem List Decreased strength;Decreased mobility;Decreased safety awareness;Decreased balance;Decreased cognition       PT Treatment Interventions Functional mobility training;Patient/family education;Balance training;Gait training;Therapeutic activities;Therapeutic exercise;DME instruction;Stair training    PT Goals (Current goals can be found in the Care Plan section)  Acute Rehab PT Goals Patient Stated Goal: to not have to rely on people as much PT Goal Formulation: With patient Time For Goal Achievement: 04/09/18 Potential to Achieve Goals: Fair    Frequency Min 3X/week   Barriers to discharge        Co-evaluation               AM-PAC PT "6 Clicks" Mobility  Outcome Measure Help needed turning from your back to your side while in a flat bed without using bedrails?: None Help needed moving from lying on your back to sitting on the side of a flat bed without using bedrails?: A Little Help needed moving to and from a bed to a chair (including a wheelchair)?: A Little Help needed standing up from a chair using your arms (e.g., wheelchair or bedside chair)?: A Little Help needed to walk in hospital room?: A Little Help needed climbing 3-5 steps with a railing? : A Little 6 Click Score: 19    End of Session Equipment Utilized During Treatment: Gait belt Activity Tolerance: Patient tolerated treatment well Patient left: in chair;with call bell/phone within reach;with nursing/sitter in room;with chair alarm set Nurse Communication: Mobility  status PT Visit Diagnosis: Unsteadiness on feet (R26.81);Muscle weakness (generalized) (M62.81);Difficulty in walking, not elsewhere classified (R26.2)    Time: [id number] PT Time Calculation (min) (ACUTE ONLY): 25 min   Charges:   PT Evaluation $PT Eval Low Complexity: 1 Low PT Treatments $Therapeutic Activity: 8-22 mins        Mylo RedShauna Icy Fuhrmann, PT, DPT Acute Rehabilitation Services Pager [phone] Office [phone]      Blake DivineShauna A Lanier EnsignHartshorne 03/26/2018, 8:51 AM
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
## 2018-03-26 NOTE — Evaluation (Signed)
Occupational Therapy Evaluation Patient Details Name: Yvonne Beck MRN: [id number] DOB: October 06, 1923 Today's Date: 03/26/2018    History of Present Illness Patient is a 84 y/o female who presents with weakness, AMS and hallucinations. CXR-COPD. Admitted with UTI and AKI. PMH includes dementia, HTN, Rt THA in 2013, glaucoma, anxiety.    Clinical Impression   PTA patient reports living with her granddaugther and son, who provide 24/7 assistance as needed but she is able to complete ADLs with supervision. Patient admitted for above and limited by impaired balance, decreased activity tolerance, urinary urgency. Patient able to complete UB ADLs with setup assist, LB ADLs with min guard assist, toileting with min guard and toilet transfers with min guard assist.  Patient will benefit from continued OT services while admitted and [facility] to optimize independence and return to PLOF.  Will continue to follow.     Follow Up Recommendations  Home health OT;Supervision/Assistance - 24 hour    Equipment Recommendations  None recommended by OT    Recommendations for Other Services       Precautions / Restrictions Precautions Precautions: Fall Restrictions Weight Bearing Restrictions: No      Mobility Bed Mobility               General bed mobility comments: seated in reciner upon entry  Transfers Overall transfer level: Needs assistance Equipment used: Rolling walker (2 wheeled) Transfers: Sit to/from UGI CorporationStand;Stand Pivot Transfers Sit to Stand: Min guard Stand pivot transfers: Min guard       General transfer comment: min guard for safeyt and balance     Balance Overall balance assessment: Needs assistance;History of Falls Sitting-balance support: Feet supported;No upper extremity supported Sitting balance-Leahy Scale: Fair Sitting balance - Comments: ADLs with close supervision    Standing balance support: During functional activity;Bilateral upper  extremity supported Standing balance-Leahy Scale: Poor Standing balance comment: reliant on UE support during standing                            ADL either performed or assessed with clinical judgement   ADL Overall ADL's : Needs assistance/impaired     Grooming: Set up;Sitting   Upper Body Bathing: Set up;Sitting   Lower Body Bathing: Min guard;Sit to/from stand   Upper Body Dressing : Set up;Sitting   Lower Body Dressing: Min guard;Sit to/from stand Lower Body Dressing Details (indicate cue type and reason): don/doff socks with supervision seated, min guard in standing  Toilet Transfer: Min guard;Stand-pivot;Cueing for safety;BSC Toilet Transfer Details (indicate cue type and reason): min guard for safety and balance Toileting- Clothing Manipulation and Hygiene: Min guard;Sit to/from stand Toileting - Clothing Manipulation Details (indicate cue type and reason): hygiene in standing with min guard     Functional mobility during ADLs: Min guard       Vision Baseline Vision/History: Wears glasses Wears Glasses: At all times Vision Assessment?: No apparent visual deficits     Perception     Praxis      Pertinent Vitals/Pain Pain Assessment: No/denies pain Faces Pain Scale: Hurts little more Pain Location: back Pain Descriptors / Indicators: Aching;Sore Pain Intervention(s): Monitored during session;Repositioned     Hand Dominance Right   Extremity/Trunk Assessment Upper Extremity Assessment Upper Extremity Assessment: Generalized weakness   Lower Extremity Assessment Lower Extremity Assessment: Defer to PT evaluation   Cervical / Trunk Assessment Cervical / Trunk Assessment: Kyphotic   Communication Communication Communication: [facility]  Cognition Arousal/Alertness: Awake/alert Behavior During Therapy: WFL for tasks assessed/performed Overall Cognitive Status: History of cognitive impairments - at baseline                                  General Comments: A&Ox3. Pleasant.    General Comments  increased time toileting due to inability to control urine; RN notified of purewick removed to use 3:1     Exercises     Shoulder Instructions      Home Living Family/patient expects to be discharged to:: Private residence Living Arrangements: Children;Other relatives(son, granddaughter) Available Help at Discharge: Family;Available 24 hours/day Type of Home: House Home Access: Stairs to enter Entergy CorporationEntrance Stairs-Number of Steps: a few Entrance Stairs-Rails: Right;Left Home Layout: One level     Bathroom Shower/Tub: Producer, television/film/videoWalk-in shower   Bathroom Toilet: Handicapped height     Home Equipment: Environmental consultantWalker - 2 wheels;Shower seat;Grab bars - tub/shower;Grab bars - toilet;Bedside commode          Prior Functioning/Environment Level of Independence: Needs assistance  Gait / Transfers Assistance Needed: Uses RW for ambulation. Reports falls. ADL's / Homemaking Assistance Needed: Does ADLs mostly on own but has granddaughter help as needed.             OT Problem List: Decreased strength;Decreased activity tolerance;Impaired balance (sitting and/or standing);Decreased cognition      OT Treatment/Interventions: Self-care/ADL training;DME and/or AE instruction;Therapeutic activities;Balance training;Patient/family education    OT Goals(Current goals can be found in the care plan section) Acute Rehab OT Goals Patient Stated Goal: to get home  OT Goal Formulation: With patient Time For Goal Achievement: 04/09/18 Potential to Achieve Goals: Good  OT Frequency: Min 2X/week   Barriers to D/C:            Co-evaluation              AM-PAC OT "6 Clicks" Daily Activity     Outcome Measure Help from another person eating meals?: None Help from another person taking care of personal grooming?: A Little Help from another person toileting, which includes using toliet, bedpan, or urinal?: A Little Help from another person  bathing (including washing, rinsing, drying)?: A Little Help from another person to put on and taking off regular upper body clothing?: None Help from another person to put on and taking off regular lower body clothing?: A Little 6 Click Score: 20   End of Session Nurse Communication: Mobility status;Other (comment)(purewick needed to be placed)  Activity Tolerance: Patient tolerated treatment well Patient left: in chair;with call bell/phone within reach;with chair alarm set  OT Visit Diagnosis: Other abnormalities of gait and mobility (R26.89);Muscle weakness (generalized) (M62.81);History of falling (Z91.81)                Time: [id number] OT Time Calculation (min): 21 min Charges:  OT General Charges $OT Visit: 1 Visit OT Evaluation $OT Eval Moderate Complexity: 1 Mod  Chancy Milroyhristie S Darielys Giglia, OT Acute Rehabilitation Services Pager [phone] Office [phone]   Chancy MilroyChristie S Sharlena Kristensen 03/26/2018, 10:01 AM

## 2018-03-26 NOTE — Plan of Care (Signed)
  Problem: Education: Goal: Knowledge of General Education information will improve Description: Including pain rating scale, medication(s)/side effects and non-pharmacologic comfort measures Outcome: Progressing   Problem: Clinical Measurements: Goal: Ability to maintain clinical measurements within normal limits will improve Outcome: Progressing   Problem: Clinical Measurements: Goal: Respiratory complications will improve Outcome: Progressing   

## 2018-03-27 DIAGNOSIS — F0391 Unspecified dementia with behavioral disturbance: Secondary | ICD-10-CM

## 2018-03-27 LAB — CBC
HCT: 40.3 % (ref 36.0–46.0)
Hemoglobin: 12.7 g/dL (ref 12.0–15.0)
MCH: 31.6 pg (ref 26.0–34.0)
MCHC: 31.5 g/dL (ref 30.0–36.0)
MCV: 100.2 fL — ABNORMAL HIGH (ref 80.0–100.0)
PLATELETS: 229 10*3/uL (ref 150–400)
RBC: 4.02 MIL/uL (ref 3.87–5.11)
RDW: 12.9 % (ref 11.5–15.5)
WBC: 7.8 10*3/uL (ref 4.0–10.5)
nRBC: 0 % (ref 0.0–0.2)

## 2018-03-27 LAB — BASIC METABOLIC PANEL
Anion gap: 11 (ref 5–15)
BUN: 20 mg/dL (ref 8–23)
CO2: 24 mmol/L (ref 22–32)
Calcium: 9.4 mg/dL (ref 8.9–10.3)
Chloride: 104 mmol/L (ref 98–111)
Creatinine, Ser: 1.64 mg/dL — ABNORMAL HIGH (ref 0.44–1.00)
GFR calc Af Amer: 31 mL/min — ABNORMAL LOW (ref >60)
GFR, EST NON AFRICAN AMERICAN: 27 mL/min — AB (ref >60)
Glucose, Bld: 126 mg/dL — ABNORMAL HIGH (ref 70–99)
POTASSIUM: 4.8 mmol/L (ref 3.5–5.1)
Sodium: 139 mmol/L (ref 135–145)

## 2018-03-27 MED ORDER — CEPHALEXIN 500 MG PO CAPS: 500.0000 mg | ORAL_CAPSULE | Freq: Two times a day (BID) | ORAL | 0 refills | Status: DC

## 2018-03-27 MED ORDER — CEPHALEXIN 500 MG PO CAPS
500.0000 mg | ORAL_CAPSULE | Freq: Two times a day (BID) | ORAL | Status: DC
  Administered 2018-03-27: 500 mg via ORAL
  Filled 2018-03-27: qty 1

## 2018-03-27 MED ORDER — SODIUM CHLORIDE 0.9 % IV BOLUS
1000.0000 mL | Freq: Once | INTRAVENOUS | Status: AC
  Administered 2018-03-27: 1000 mL via INTRAVENOUS
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
## 2018-03-27 NOTE — Care Management Note (Addendum)
Case Management Note  Patient Details  Name: Yvonne Beck MRN: [id number] Date of Birth: 05-30-1923  Subjective/Objective:                    Action/Plan:Have not heard back from family patient has had [facility] in past. [facility]   PT recommending home health PT.Spoke with bedside nurse Yvonne Beck , son Yvonne Beck is contact person [phone]. Called Yvonne left a message with my direct phone number    Expected Discharge Date:  03/27/18               Expected Discharge Plan:  Home w Home Health Services  In-House Referral:     Discharge planning Services  CM Consult  Post Acute Care Choice:  Home Health Choice offered to:  Adult Children  DME Arranged:  N/A DME Agency:     HH Arranged:  PT HH Agency:     Status of Service:  In process, will continue to follow  If discussed at Long Length of Stay Meetings, dates discussed:    Additional Comments:  Kingsley PlanWile, Gean Larose Marie, RN 03/27/2018, 4:01 PM

## 2018-03-27 NOTE — Progress Notes (Signed)
VAST RN contacted unit RN, Miranda regarding consult for PIV placement. Miranda, RN stated she had not had time to let IVT know to disregard. Pt is being discharged and does not need an IV.

## 2018-03-27 NOTE — Progress Notes (Signed)
Left message with Ronny FlurryHeather Wile that patient has been discharged and needs [facility].

## 2018-03-27 NOTE — Progress Notes (Signed)
Physical Therapy Treatment Patient Details Name: Yvonne CasinoBetty J Nasser MRN: [id number] DOB: 1924/01/16 Today's Date: 03/27/2018    History of Present Illness Patient is a 84 y/o female who presents with weakness, AMS and hallucinations. CXR-COPD. Admitted with UTI and AKI. PMH includes dementia, HTN, Rt THA in 2013, glaucoma, anxiety.     PT Comments    Pt received in recliner, pleasant and agreeable to participation in therapy. She required min guard assist sit to stand and min assist ambulation 150 feet with RW. Pt provided with mesh underwear and pad to assist with incontinence during mobility. PT to continue per POC.     Follow Up Recommendations  Home health PT;Supervision for mobility/OOB     Equipment Recommendations  None recommended by PT    Recommendations for Other Services       Precautions / Restrictions Precautions Precautions: Fall    Mobility  Bed Mobility               General bed mobility comments: seated in reciner upon entry  Transfers Overall transfer level: Needs assistance Equipment used: Rolling walker (2 wheeled) Transfers: Sit to/from Stand Sit to Stand: Min guard         General transfer comment: cues for hand placement, min guard for safety, increased time and effort  Ambulation/Gait Ambulation/Gait assistance: Min assist Gait Distance (Feet): 150 Feet Assistive device: Rolling walker (2 wheeled) Gait Pattern/deviations: Step-through pattern;Decreased stride length;Drifts right/left;Trunk flexed Gait velocity: decreased Gait velocity interpretation: 1.31 - 2.62 ft/sec, indicative of limited community ambulator General Gait Details: mildly unsteady gait, assist with balance and RW management   Stairs             Wheelchair Mobility    Modified Rankin (Stroke Patients Only)       Balance Overall balance assessment: Needs assistance;History of Falls Sitting-balance support: Feet supported;No upper extremity  supported Sitting balance-Leahy Scale: Good     Standing balance support: During functional activity;Bilateral upper extremity supported Standing balance-Leahy Scale: Poor Standing balance comment: reliant on UE support during standing                             Cognition Arousal/Alertness: Awake/alert Behavior During Therapy: WFL for tasks assessed/performed Overall Cognitive Status: History of cognitive impairments - at baseline                                 General Comments: A&Ox3. Pleasant.       Exercises      General Comments        Pertinent Vitals/Pain Pain Assessment: No/denies pain    Home Living                      Prior Function            PT Goals (current goals can now be found in the care plan section) Acute Rehab PT Goals Patient Stated Goal: to get home  PT Goal Formulation: With patient Time For Goal Achievement: 04/09/18 Potential to Achieve Goals: Fair Progress towards PT goals: Progressing toward goals    Frequency    Min 3X/week      PT Plan Current plan remains appropriate    Co-evaluation              AM-PAC PT "6 Clicks" Mobility   Outcome Measure  Help needed turning from  your back to your side while in a flat bed without using bedrails?: None Help needed moving from lying on your back to sitting on the side of a flat bed without using bedrails?: A Little Help needed moving to and from a bed to a chair (including a wheelchair)?: A Little Help needed standing up from a chair using your arms (e.g., wheelchair or bedside chair)?: A Little Help needed to walk in hospital room?: A Little Help needed climbing 3-5 steps with a railing? : A Little 6 Click Score: 19    End of Session Equipment Utilized During Treatment: Gait belt Activity Tolerance: Patient tolerated treatment well Patient left: in chair;with call bell/phone within reach;with chair alarm set Nurse Communication: Mobility  status PT Visit Diagnosis: Unsteadiness on feet (R26.81);Muscle weakness (generalized) (M62.81);Difficulty in walking, not elsewhere classified (R26.2)     Time: [id number] PT Time Calculation (min) (ACUTE ONLY): 17 min  Charges:  $Gait Training: 8-22 mins                     Aida RaiderWendy Veatrice Eckstein, PT  Office # [phone] Pager [phone]    Ilda FoilGarrow, Latoya Diskin Rene 03/27/2018, 9:33 AM

## 2018-03-27 NOTE — Progress Notes (Addendum)
The patient lost her IV overnight and would not allow another. She appears quite calm to me now and is agreeable to an IV. I have asked the nursing staff to obtain one in order for me to giver her some IVF. She ambulated in the hall with minimal assistance and will go home with HHPT most likely after IVF given. I will speak with family.  Full note to follow.   Calvert CantorSaima Shaia Porath, MD

## 2018-03-30 DIAGNOSIS — Z792 Long term (current) use of antibiotics: Secondary | ICD-10-CM | POA: Diagnosis not present

## 2018-03-30 DIAGNOSIS — H409 Unspecified glaucoma: Secondary | ICD-10-CM | POA: Diagnosis not present

## 2018-03-30 DIAGNOSIS — Z9181 History of falling: Secondary | ICD-10-CM | POA: Diagnosis not present

## 2018-03-30 DIAGNOSIS — N183 Chronic kidney disease, stage 3 (moderate): Secondary | ICD-10-CM | POA: Diagnosis not present

## 2018-03-30 DIAGNOSIS — N390 Urinary tract infection, site not specified: Secondary | ICD-10-CM | POA: Diagnosis not present

## 2018-03-30 DIAGNOSIS — D509 Iron deficiency anemia, unspecified: Secondary | ICD-10-CM | POA: Diagnosis not present

## 2018-03-30 DIAGNOSIS — J449 Chronic obstructive pulmonary disease, unspecified: Secondary | ICD-10-CM | POA: Diagnosis not present

## 2018-03-30 DIAGNOSIS — F419 Anxiety disorder, unspecified: Secondary | ICD-10-CM | POA: Diagnosis not present

## 2018-03-30 DIAGNOSIS — F0391 Unspecified dementia with behavioral disturbance: Secondary | ICD-10-CM | POA: Diagnosis not present

## 2018-03-30 DIAGNOSIS — M1990 Unspecified osteoarthritis, unspecified site: Secondary | ICD-10-CM | POA: Diagnosis not present

## 2018-03-30 DIAGNOSIS — Z96641 Presence of right artificial hip joint: Secondary | ICD-10-CM | POA: Diagnosis not present

## 2018-03-30 DIAGNOSIS — I129 Hypertensive chronic kidney disease with stage 1 through stage 4 chronic kidney disease, or unspecified chronic kidney disease: Secondary | ICD-10-CM | POA: Diagnosis not present

## 2018-03-30 DIAGNOSIS — Z87891 Personal history of nicotine dependence: Secondary | ICD-10-CM | POA: Diagnosis not present

## 2018-03-30 LAB — CULTURE, BLOOD (ROUTINE X 2)
Culture: NO GROWTH
Culture: NO GROWTH
Special Requests: ADEQUATE

## 2018-04-09 DIAGNOSIS — Z792 Long term (current) use of antibiotics: Secondary | ICD-10-CM | POA: Diagnosis not present

## 2018-04-09 DIAGNOSIS — N390 Urinary tract infection, site not specified: Secondary | ICD-10-CM | POA: Diagnosis not present

## 2018-04-09 DIAGNOSIS — Z9181 History of falling: Secondary | ICD-10-CM | POA: Diagnosis not present

## 2018-04-09 DIAGNOSIS — M1990 Unspecified osteoarthritis, unspecified site: Secondary | ICD-10-CM | POA: Diagnosis not present

## 2018-04-09 DIAGNOSIS — Z87891 Personal history of nicotine dependence: Secondary | ICD-10-CM | POA: Diagnosis not present

## 2018-04-09 DIAGNOSIS — F0391 Unspecified dementia with behavioral disturbance: Secondary | ICD-10-CM | POA: Diagnosis not present

## 2018-04-09 DIAGNOSIS — S81802A Unspecified open wound, left lower leg, initial encounter: Secondary | ICD-10-CM | POA: Diagnosis not present

## 2018-04-09 DIAGNOSIS — Z96641 Presence of right artificial hip joint: Secondary | ICD-10-CM | POA: Diagnosis not present

## 2018-05-10 ENCOUNTER — Emergency Department (HOSPITAL_COMMUNITY)
Admission: EM | Admit: 2018-05-10 | Discharge: 2018-05-10 | Disposition: A | Discharge status: Home / Self Care | Payer: PPO | Attending: Emergency Medicine | Admitting: Emergency Medicine

## 2018-05-10 ENCOUNTER — Encounter (HOSPITAL_COMMUNITY): Payer: Self-pay

## 2018-05-10 ENCOUNTER — Emergency Department (HOSPITAL_COMMUNITY): Payer: PPO

## 2018-05-10 DIAGNOSIS — W19XXXA Unspecified fall, initial encounter: Secondary | ICD-10-CM | POA: Diagnosis not present

## 2018-05-10 DIAGNOSIS — Y998 Other external cause status: Secondary | ICD-10-CM | POA: Insufficient documentation

## 2018-05-10 DIAGNOSIS — I129 Hypertensive chronic kidney disease with stage 1 through stage 4 chronic kidney disease, or unspecified chronic kidney disease: Secondary | ICD-10-CM | POA: Insufficient documentation

## 2018-05-10 DIAGNOSIS — Y92003 Bedroom of unspecified non-institutional (private) residence as the place of occurrence of the external cause: Secondary | ICD-10-CM | POA: Insufficient documentation

## 2018-05-10 DIAGNOSIS — Z79899 Other long term (current) drug therapy: Secondary | ICD-10-CM | POA: Diagnosis not present

## 2018-05-10 DIAGNOSIS — Y939 Activity, unspecified: Secondary | ICD-10-CM | POA: Insufficient documentation

## 2018-05-10 DIAGNOSIS — S0990XA Unspecified injury of head, initial encounter: Secondary | ICD-10-CM | POA: Insufficient documentation

## 2018-05-10 DIAGNOSIS — W010XXA Fall on same level from slipping, tripping and stumbling without subsequent striking against object, initial encounter: Secondary | ICD-10-CM | POA: Diagnosis not present

## 2018-05-10 DIAGNOSIS — S199XXA Unspecified injury of neck, initial encounter: Secondary | ICD-10-CM | POA: Diagnosis not present

## 2018-05-10 DIAGNOSIS — Z87891 Personal history of nicotine dependence: Secondary | ICD-10-CM | POA: Insufficient documentation

## 2018-05-10 DIAGNOSIS — N183 Chronic kidney disease, stage 3 (moderate): Secondary | ICD-10-CM | POA: Insufficient documentation

## 2018-05-10 DIAGNOSIS — R220 Localized swelling, mass and lump, head: Secondary | ICD-10-CM | POA: Diagnosis not present

## 2018-05-10 DIAGNOSIS — I10 Essential (primary) hypertension: Secondary | ICD-10-CM | POA: Diagnosis not present

## 2018-05-10 NOTE — ED Notes (Signed)
Pt discharged from ED; instructions provided; Pt encouraged to return to ED if symptoms worsen and to f/u with PCP; Pt verbalized understanding of all instructions 

## 2018-05-10 NOTE — ED Triage Notes (Signed)
Pt arrived via [facility]; pt frm hm with c/o call after tripping over her feet; pt fell backwards and has hematoma (golfball size) on back of head. 250/110, 60, CBG 98, 98% on RA; 20

## 2018-05-10 NOTE — ED Provider Notes (Signed)
[facility] Note   CSN: [id number] Arrival date & time: 05/10/18  0152     History   Chief Complaint Chief Complaint  Patient presents with  . Fall    HPI Yvonne Beck is a 85 y.o. female.  85 year old female with past medical history including hypertension, glaucoma, memory loss who presents with fall and head injury.  This evening, she was trying to get her glasses off of a dresser and tripped over her feet, causing her to fall backwards and strike the back of her head on the floor.  She denies any loss of consciousness.  She denies any areas of pain aside from her head.  Pain is mild.  No vomiting or vision changes.  The history is provided by the patient.  Fall     Past Medical History:  Diagnosis Date  . Anxiety   . Arthritis   . Glaucoma, both eyes   . Hypertension     Patient Active Problem List   Diagnosis Date Noted  . Memory loss 03/26/2018  . UTI (urinary tract infection) 03/26/2018  . Lower urinary tract infectious disease 03/25/2018  . Acute metabolic encephalopathy 03/25/2018  . Essential hypertension 03/25/2018  . Acute renal failure superimposed on stage 3 chronic kidney disease (HCC) 03/25/2018  . Iron deficiency anemia 03/25/2018  . Fall 03/25/2018  . AKI (acute kidney injury) (HCC) 03/05/2017  . Acute kidney injury (HCC) 03/04/2017  . Near syncope 03/04/2017  . Acute anxiety 03/04/2017  . Uncontrolled hypertension 03/04/2017  . Postoperative anemia due to acute blood loss 02/11/2012  . Avascular necrosis of femoral head (HCC) 02/10/2012    Past Surgical History:  Procedure Laterality Date  . JOINT REPLACEMENT    . TOTAL HIP ARTHROPLASTY  02/10/2012   Procedure: TOTAL HIP ARTHROPLASTY;  Surgeon: Jacki Conesonald A Gioffre, MD;  Location: [facility];  Service: Orthopedics;  Laterality: Right;     OB History   No obstetric history on file.      Home Medications    Prior to Admission medications     Medication Sig Start Date End Date Taking? Authorizing Provider  acetaminophen (TYLENOL) 325 MG tablet Take 2 tablets (650 mg total) by mouth every 6 (six) hours as needed for mild pain or headache. 03/07/17  Yes Alwyn RenMathews, Elizabeth G, MD  docusate sodium (COLACE) 100 MG capsule Take 100 mg by mouth daily.   Yes [provider]  ferrous sulfate 325 (65 FE) MG tablet Take 1 tablet (325 mg total) by mouth 3 (three) times daily after meals. Patient taking differently: Take 325 mg by mouth 2 (two) times daily with a meal.  02/14/12  Yes Constable, Amber, PA-C  hydrALAZINE (APRESOLINE) 25 MG tablet Take 1 tablet (25 mg total) by mouth every 8 (eight) hours. 03/07/17  Yes Alwyn RenMathews, Elizabeth G, MD  latanoprost (XALATAN) 0.005 % ophthalmic solution Place 1 drop into the right eye at bedtime.    Yes [provider]  metoprolol tartrate (LOPRESSOR) 25 MG tablet Take 0.5 tablets (12.5 mg total) by mouth 2 (two) times daily. 03/07/17  Yes Alwyn RenMathews, Elizabeth G, MD  timolol (TIMOPTIC-XR) 0.25 % ophthalmic gel-forming Place 1 drop into the right eye daily.    Yes [provider]  cephALEXin (KEFLEX) 500 MG capsule Take 1 capsule (500 mg total) by mouth every 12 (twelve) hours. Patient not taking: Reported on 05/10/2018 03/27/18   Calvert Cantorizwan, Saima, MD  traMADol (ULTRAM) 50 MG tablet Take 1 tablet (50  mg total) by mouth every 6 (six) hours as needed for severe pain. Patient not taking: Reported on 05/10/2018 05/26/17   Charlestine NightLawyer, Christopher, PA-C    Family History Family History  Problem Relation Age of Onset  . Stroke Sister     Social History Social History   Tobacco Use  . Smoking status: Former Games developermoker  . Smokeless tobacco: Never Used  Substance Use Topics  . Alcohol use: No  . Drug use: No     Allergies   Prednisone and Tramadol   Review of Systems Review of Systems All other systems reviewed and are negative except that which was mentioned in HPI   Physical Exam Updated  Vital Signs BP (!) 176/77   Pulse 60   Temp 97.8 F (36.6 C) (Oral)   Resp (!) 23   SpO2 96%   Physical Exam Vitals signs and nursing note reviewed.  Constitutional:      General: She is not in acute distress.    Appearance: She is well-developed.  HENT:     Head: Normocephalic.     Comments: Hematoma posterior scalp Eyes:     Extraocular Movements: Extraocular movements intact.     Conjunctiva/sclera: Conjunctivae normal.     Pupils: Pupils are equal, round, and reactive to light.  Neck:     Musculoskeletal: Normal range of motion and neck supple. No muscular tenderness.  Cardiovascular:     Rate and Rhythm: Regular rhythm. Bradycardia present.     Heart sounds: Normal heart sounds. No murmur.  Pulmonary:     Effort: Pulmonary effort is normal.     Breath sounds: Normal breath sounds.  Chest:     Chest wall: No tenderness.  Abdominal:     General: Bowel sounds are normal. There is no distension.     Palpations: Abdomen is soft.     Tenderness: There is no abdominal tenderness.  Musculoskeletal:        General: No swelling, tenderness, deformity or signs of injury.  Skin:    General: Skin is warm and dry.  Neurological:     Mental Status: She is alert and oriented to person, place, and time.     Sensory: No sensory deficit.     Motor: No weakness.     Comments: Occasional word finding difficulty  Psychiatric:     Comments: Calm, pleasant, cooperative      ED Treatments / Results  Labs (all labs ordered are listed, but only abnormal results are displayed) Labs Reviewed - No data to display  EKG EKG Interpretation  Date/Time:  Sunday May 10 2018 02:06:20 EST Ventricular Rate:  61 PR Interval:    QRS Duration: 108 QT Interval:  444 QTC Calculation: 448 R Axis:   81 Text Interpretation:  Sinus rhythm Borderline prolonged PR interval Consider left atrial enlargement Borderline right axis deviation similar to previous Confirmed by Frederick PeersLittle, Kate Sweetman [phone]([id number])  on 05/10/2018 3:42:10 AM   Radiology Ct Head Wo Contrast  Result Date: 05/10/2018 CLINICAL DATA:  Fall, hit back of head. EXAM: CT HEAD WITHOUT CONTRAST CT CERVICAL SPINE WITHOUT CONTRAST TECHNIQUE: Multidetector CT imaging of the head and cervical spine was performed following the standard protocol without intravenous contrast. Multiplanar CT image reconstructions of the cervical spine were also generated. COMPARISON:  03/25/2018 FINDINGS: CT HEAD FINDINGS Brain: There is atrophy and chronic small vessel disease changes. No acute intracranial abnormality. Specifically, no hemorrhage, hydrocephalus, mass lesion, acute infarction, or significant intracranial injury. Vascular: No hyperdense vessel or unexpected  calcification. Skull: No acute calvarial abnormality. Sinuses/Orbits: Visualized paranasal sinuses and mastoids clear. Orbital soft tissues unremarkable. Other: Soft tissue swelling in the high scalp. CT CERVICAL SPINE FINDINGS Alignment: No subluxation. Skull base and vertebrae: No acute fracture. No primary bone lesion or focal pathologic process. Soft tissues and spinal canal: No prevertebral fluid or swelling. No visible canal hematoma. Disc levels:  Diffuse degenerative disc and facet disease. Upper chest: Biapical scarring. Other: None IMPRESSION: Diffuse cerebral atrophy. No acute bony abnormality in the cervical spine. Electronically Signed   By: Charlett Nose M.D.   On: 05/10/2018 03:51   Ct Cervical Spine Wo Contrast  Result Date: 05/10/2018 CLINICAL DATA:  Fall, hit back of head. EXAM: CT HEAD WITHOUT CONTRAST CT CERVICAL SPINE WITHOUT CONTRAST TECHNIQUE: Multidetector CT imaging of the head and cervical spine was performed following the standard protocol without intravenous contrast. Multiplanar CT image reconstructions of the cervical spine were also generated. COMPARISON:  03/25/2018 FINDINGS: CT HEAD FINDINGS Brain: There is atrophy and chronic small vessel disease changes. No acute  intracranial abnormality. Specifically, no hemorrhage, hydrocephalus, mass lesion, acute infarction, or significant intracranial injury. Vascular: No hyperdense vessel or unexpected calcification. Skull: No acute calvarial abnormality. Sinuses/Orbits: Visualized paranasal sinuses and mastoids clear. Orbital soft tissues unremarkable. Other: Soft tissue swelling in the high scalp. CT CERVICAL SPINE FINDINGS Alignment: No subluxation. Skull base and vertebrae: No acute fracture. No primary bone lesion or focal pathologic process. Soft tissues and spinal canal: No prevertebral fluid or swelling. No visible canal hematoma. Disc levels:  Diffuse degenerative disc and facet disease. Upper chest: Biapical scarring. Other: None IMPRESSION: Diffuse cerebral atrophy. No acute bony abnormality in the cervical spine. Electronically Signed   By: Charlett Nose M.D.   On: 05/10/2018 03:51    Procedures Procedures (including critical care time)  Medications Ordered in ED Medications - No data to display   Initial Impression / Assessment and Plan / ED Course  I have reviewed the triage vital signs and the nursing notes.  Pertinent imaging results that were available during my care of the patient were reviewed by me and considered in my medical decision making (see chart for details).    Comfortable on exam, hematoma posterior scalp but no other areas of pain. CT head and C-spine negative acute. PT without complaints on reassessment. Discussed supportive measures and return precautions w/ pt and her granddaughter. They voiced understanding.  Final Clinical Impressions(s) / ED Diagnoses   Final diagnoses:  Fall, initial encounter  Injury of head, initial encounter    ED Discharge Orders    None       Albertus Chiarelli, Ambrose Finland, MD 05/10/18 [phone]

## 2019-01-17 ENCOUNTER — Emergency Department (HOSPITAL_COMMUNITY)
Admission: EM | Admit: 2019-01-17 | Discharge: 2019-01-17 | Disposition: A | Discharge status: Home / Self Care | Payer: PPO | Attending: Emergency Medicine | Admitting: Emergency Medicine

## 2019-01-17 ENCOUNTER — Other Ambulatory Visit: Payer: Self-pay

## 2019-01-17 ENCOUNTER — Emergency Department (HOSPITAL_COMMUNITY): Payer: PPO

## 2019-01-17 DIAGNOSIS — Z79899 Other long term (current) drug therapy: Secondary | ICD-10-CM | POA: Diagnosis not present

## 2019-01-17 DIAGNOSIS — Y9389 Activity, other specified: Secondary | ICD-10-CM | POA: Insufficient documentation

## 2019-01-17 DIAGNOSIS — W01198A Fall on same level from slipping, tripping and stumbling with subsequent striking against other object, initial encounter: Secondary | ICD-10-CM | POA: Insufficient documentation

## 2019-01-17 DIAGNOSIS — R030 Elevated blood-pressure reading, without diagnosis of hypertension: Secondary | ICD-10-CM

## 2019-01-17 DIAGNOSIS — Z87891 Personal history of nicotine dependence: Secondary | ICD-10-CM | POA: Insufficient documentation

## 2019-01-17 DIAGNOSIS — W19XXXA Unspecified fall, initial encounter: Secondary | ICD-10-CM

## 2019-01-17 DIAGNOSIS — S0003XA Contusion of scalp, initial encounter: Secondary | ICD-10-CM | POA: Diagnosis not present

## 2019-01-17 DIAGNOSIS — I129 Hypertensive chronic kidney disease with stage 1 through stage 4 chronic kidney disease, or unspecified chronic kidney disease: Secondary | ICD-10-CM | POA: Insufficient documentation

## 2019-01-17 DIAGNOSIS — S51811A Laceration without foreign body of right forearm, initial encounter: Secondary | ICD-10-CM | POA: Diagnosis not present

## 2019-01-17 DIAGNOSIS — N183 Chronic kidney disease, stage 3 unspecified: Secondary | ICD-10-CM | POA: Insufficient documentation

## 2019-01-17 DIAGNOSIS — Z23 Encounter for immunization: Secondary | ICD-10-CM | POA: Insufficient documentation

## 2019-01-17 DIAGNOSIS — Y9289 Other specified places as the place of occurrence of the external cause: Secondary | ICD-10-CM | POA: Insufficient documentation

## 2019-01-17 DIAGNOSIS — Y999 Unspecified external cause status: Secondary | ICD-10-CM | POA: Diagnosis not present

## 2019-01-17 DIAGNOSIS — R404 Transient alteration of awareness: Secondary | ICD-10-CM | POA: Diagnosis not present

## 2019-01-17 DIAGNOSIS — I10 Essential (primary) hypertension: Secondary | ICD-10-CM | POA: Diagnosis not present

## 2019-01-17 DIAGNOSIS — G4489 Other headache syndrome: Secondary | ICD-10-CM | POA: Diagnosis not present

## 2019-01-17 DIAGNOSIS — S0990XA Unspecified injury of head, initial encounter: Secondary | ICD-10-CM | POA: Diagnosis not present

## 2019-01-17 DIAGNOSIS — M542 Cervicalgia: Secondary | ICD-10-CM | POA: Diagnosis not present

## 2019-01-17 LAB — CBC WITH DIFFERENTIAL/PLATELET
Abs Immature Granulocytes: 0.02 10*3/uL (ref 0.00–0.07)
Basophils Absolute: 0 10*3/uL (ref 0.0–0.1)
Basophils Relative: 1 %
Eosinophils Absolute: 0.1 10*3/uL (ref 0.0–0.5)
Eosinophils Relative: 2 %
HCT: 36.6 % (ref 36.0–46.0)
Hemoglobin: 11.7 g/dL — ABNORMAL LOW (ref 12.0–15.0)
Immature Granulocytes: 0 %
Lymphocytes Relative: 20 %
Lymphs Abs: 1.2 10*3/uL (ref 0.7–4.0)
MCH: 32.9 pg (ref 26.0–34.0)
MCHC: 32 g/dL (ref 30.0–36.0)
MCV: 102.8 fL — ABNORMAL HIGH (ref 80.0–100.0)
Monocytes Absolute: 0.7 10*3/uL (ref 0.1–1.0)
Monocytes Relative: 12 %
Neutro Abs: 3.7 10*3/uL (ref 1.7–7.7)
Neutrophils Relative %: 65 %
Platelets: 157 10*3/uL (ref 150–400)
RBC: 3.56 MIL/uL — ABNORMAL LOW (ref 3.87–5.11)
RDW: 13.4 % (ref 11.5–15.5)
WBC: 5.7 10*3/uL (ref 4.0–10.5)
nRBC: 0 % (ref 0.0–0.2)

## 2019-01-17 LAB — COMPREHENSIVE METABOLIC PANEL
ALT: 9 U/L (ref 0–44)
AST: 15 U/L (ref 15–41)
Albumin: 3.7 g/dL (ref 3.5–5.0)
Alkaline Phosphatase: 64 U/L (ref 38–126)
Anion gap: 12 (ref 5–15)
BUN: 44 mg/dL — ABNORMAL HIGH (ref 8–23)
CO2: 20 mmol/L — ABNORMAL LOW (ref 22–32)
Calcium: 9.4 mg/dL (ref 8.9–10.3)
Chloride: 105 mmol/L (ref 98–111)
Creatinine, Ser: 1.92 mg/dL — ABNORMAL HIGH (ref 0.44–1.00)
GFR calc Af Amer: 26 mL/min — ABNORMAL LOW (ref >60)
GFR calc non Af Amer: 22 mL/min — ABNORMAL LOW (ref >60)
Glucose, Bld: 94 mg/dL (ref 70–99)
Potassium: 4.8 mmol/L (ref 3.5–5.1)
Sodium: 137 mmol/L (ref 135–145)
Total Bilirubin: 0.5 mg/dL (ref 0.3–1.2)
Total Protein: 6.5 g/dL (ref 6.5–8.1)

## 2019-01-17 LAB — URINALYSIS, ROUTINE W REFLEX MICROSCOPIC
Bilirubin Urine: NEGATIVE
Glucose, UA: NEGATIVE mg/dL
Hgb urine dipstick: NEGATIVE
Ketones, ur: NEGATIVE mg/dL
Leukocytes,Ua: NEGATIVE
Nitrite: NEGATIVE
Protein, ur: 30 mg/dL — AB
Specific Gravity, Urine: 1.009 (ref 1.005–1.030)
pH: 5 (ref 5.0–8.0)

## 2019-01-17 MED ORDER — TETANUS-DIPHTH-ACELL PERTUSSIS 5-2.5-18.5 LF-MCG/0.5 IM SUSP
0.5000 mL | Freq: Once | INTRAMUSCULAR | Status: AC
  Administered 2019-01-17: 0.5 mL via INTRAMUSCULAR
  Filled 2019-01-17: qty 0.5

## 2019-01-17 MED ORDER — ACETAMINOPHEN 325 MG PO TABS
650.0000 mg | ORAL_TABLET | Freq: Once | ORAL | Status: AC
  Administered 2019-01-17: 20:00:00 650 mg via ORAL
  Filled 2019-01-17: qty 2

## 2019-01-17 NOTE — ED Notes (Signed)
Patient verbalizes understanding of discharge instructions. Opportunity for questioning and answers were provided. Armband removed by staff, pt discharged from ED.  

## 2019-01-17 NOTE — Discharge Instructions (Addendum)
You have been diagnosed today with fall with head injury and elevated blood pressure.  At this time there does not appear to be the presence of an emergent medical condition, however there is always the potential for conditions to change. Please read and follow the below instructions.  Please return to the Emergency Department immediately for any new or worsening symptoms. Please be sure to follow up with your Primary Care Provider within one week regarding your visit today; please call their office to schedule an appointment even if you are feeling better for a follow-up visit. Your CT scan today's showed the bruise of your right forehead, left sphenoid sinusitis, chronic microvascular ischemic changes, multilevel degenerative changes of the cervical spine and unchanged grade 1 anterolisthesis of C2 on C3.  Please discuss all of these findings with your primary care provider at your next visit. Your blood pressure was elevated today.  Please be sure to take your blood pressure medications as directed by your primary care provider.  Call your primary care doctor tomorrow morning to schedule follow-up within the next week for blood pressure recheck and medication management. A consult has been put in for face-to-face evaluation for possible home health aide.  You should be contacted by [facility] health for this in the next few days. Please apply antibiotic ointment to the skin tear on her right forearm daily.  Change bandage twice daily and monitor for signs of infection.  Follow-up with primary care doctor in the next week for recheck.  Get help right away if: You have: A very bad headache that is not helped by medicine. Trouble walking or weakness in your arms and legs. Clear or bloody fluid coming from your nose or ears. Changes in how you see (vision). Shaking movements that you cannot control. You lose your balance. You vomit. The black centers of your eyes (pupils) change in size. Your speech is  slurred. Your dizziness gets worse. You have a red streak that goes away from the skin tear. You have a fever and chills, and your symptoms get worse all of a sudden. You pass out. Start to feel mixed up (confused). Feel weak or numb. Feel faint. Have very bad pain in your: Chest. Belly (abdomen). Throw up more than once. Have trouble breathing. You are sleepier than normal and have trouble staying awake. Your symptoms get worse.  Please read the additional information packets attached to your discharge summary.  Do not take your medicine if  develop an itchy rash, swelling in your mouth or lips, or difficulty breathing; call 911 and seek immediate emergency medical attention if this occurs.  Note: Portions of this text may have been transcribed using voice recognition software. Every effort was made to ensure accuracy; however, inadvertent computerized transcription errors may still be present.

## 2019-01-17 NOTE — ED Provider Notes (Signed)
[facility] EMERGENCY DEPARTMENT Provider Note   CSN: [id number] Arrival date & time: 01/17/19  1456     History   Chief Complaint Chief Complaint  Patient presents with   Fall    HPI Yvonne Beck is a 85 y.o. female presents from home following a witnessed fall that occurred today.  Patient reports that she was walking to the bathroom when she tripped falling forward and striking her right forehead on the ground.  No loss of consciousness or blood thinner use.  Patient reports a mild right-sided headache since that time constant no aggravating or alleviating factors.  Bruise noted to the right forehead.  Patient brought in by EMS.  Additionally a skin tear noted to the right forearm bleeding controlled with direct pressure.  Denies neck pain/stiffness, chest pain/shortness of breath, nausea/vomiting, diarrhea, abdominal pain, hip pain, pain in the extremities or any additional concerns.    HPI  Past Medical History:  Diagnosis Date   Anxiety    Arthritis    Glaucoma, both eyes    Hypertension     Patient Active Problem List   Diagnosis Date Noted   Memory loss 03/26/2018   UTI (urinary tract infection) 03/26/2018   Lower urinary tract infectious disease 03/25/2018   Acute metabolic encephalopathy 03/25/2018   Essential hypertension 03/25/2018   Acute renal failure superimposed on stage 3 chronic kidney disease (HCC) 03/25/2018   Iron deficiency anemia 03/25/2018   Fall 03/25/2018   AKI (acute kidney injury) (HCC) 03/05/2017   Acute kidney injury (HCC) 03/04/2017   Near syncope 03/04/2017   Acute anxiety 03/04/2017   Uncontrolled hypertension 03/04/2017   Postoperative anemia due to acute blood loss 02/11/2012   Avascular necrosis of femoral head (HCC) 02/10/2012    Past Surgical History:  Procedure Laterality Date   JOINT REPLACEMENT     TOTAL HIP ARTHROPLASTY  02/10/2012   Procedure: TOTAL HIP ARTHROPLASTY;  Surgeon:  Jacki Cones, MD;  Location: [facility];  Service: Orthopedics;  Laterality: Right;     OB History   No obstetric history on file.      Home Medications    Prior to Admission medications   Medication Sig Start Date End Date Taking? Authorizing Provider  acetaminophen (TYLENOL) 325 MG tablet Take 2 tablets (650 mg total) by mouth every 6 (six) hours as needed for mild pain or headache. 03/07/17   Alwyn Ren, MD  cephALEXin (KEFLEX) 500 MG capsule Take 1 capsule (500 mg total) by mouth every 12 (twelve) hours. Patient not taking: Reported on 05/10/2018 03/27/18   Calvert Cantor, MD  docusate sodium (COLACE) 100 MG capsule Take 100 mg by mouth daily.    [provider]  ferrous sulfate 325 (65 FE) MG tablet Take 1 tablet (325 mg total) by mouth 3 (three) times daily after meals. Patient taking differently: Take 325 mg by mouth 2 (two) times daily with a meal.  02/14/12   Constable, [facility], PA-C  hydrALAZINE (APRESOLINE) 25 MG tablet Take 1 tablet (25 mg total) by mouth every 8 (eight) hours. 03/07/17   Alwyn Ren, MD  latanoprost (XALATAN) 0.005 % ophthalmic solution Place 1 drop into the right eye at bedtime.     [provider]  metoprolol tartrate (LOPRESSOR) 25 MG tablet Take 0.5 tablets (12.5 mg total) by mouth 2 (two) times daily. 03/07/17   Alwyn Ren, MD  timolol (TIMOPTIC-XR) 0.25 % ophthalmic gel-forming Place 1 drop into the right eye daily.  [provider]  traMADol (ULTRAM) 50 MG tablet Take 1 tablet (50 mg total) by mouth every 6 (six) hours as needed for severe pain. Patient not taking: Reported on 05/10/2018 05/26/17   Charlestine Night, PA-C    Family History Family History  Problem Relation Age of Onset   Stroke Sister     Social History Social History   Tobacco Use   Smoking status: Former Smoker   Smokeless tobacco: Never Used  Substance Use Topics   Alcohol use: No   Drug use: No     Allergies     Prednisone and Tramadol   Review of Systems Review of Systems Ten systems are reviewed and are negative for acute change except as noted in the HPI   Physical Exam Updated Vital Signs BP (!) 190/76    Pulse (!) 50    Temp 98.6 F (37 C) (Oral)    Resp 17    SpO2 96%   Physical Exam Constitutional:      General: She is not in acute distress.    Appearance: Normal appearance. She is well-developed. She is not ill-appearing or diaphoretic.  HENT:     Head: Normocephalic. Contusion present. No raccoon eyes or Battle's sign.     Jaw: There is normal jaw occlusion. No trismus.      Right Ear: External ear normal. No hemotympanum.     Left Ear: External ear normal. No hemotympanum.     Nose: Nose normal.     Right Nostril: No epistaxis.     Left Nostril: No epistaxis.     Mouth/Throat:     Mouth: Mucous membranes are moist.     Pharynx: Oropharynx is clear.  Eyes:     General: Vision grossly intact. Gaze aligned appropriately.     Extraocular Movements: Extraocular movements intact.     Conjunctiva/sclera: Conjunctivae normal.     Pupils: Pupils are equal, round, and reactive to light.  Neck:     Musculoskeletal: Normal range of motion and neck supple. No spinous process tenderness or muscular tenderness.     Trachea: Trachea and phonation normal. No tracheal tenderness or tracheal deviation.  Cardiovascular:     Rate and Rhythm: Normal rate and regular rhythm.     Pulses:          Dorsalis pedis pulses are 2+ on the right side and 2+ on the left side.  Pulmonary:     Effort: Pulmonary effort is normal. No respiratory distress.  Chest:     Chest wall: No deformity, tenderness or crepitus.  Abdominal:     General: There is no distension.     Palpations: Abdomen is soft.     Tenderness: There is no abdominal tenderness. There is no guarding or rebound.  Musculoskeletal: Normal range of motion.     Comments: No midline C/T/L spinal tenderness to palpation, no paraspinal  muscle tenderness, no deformity, crepitus, or step-off noted. No sign of injury to the neck or back. - Hips stable to compression bilaterally without pain.  Patient able to bring bilateral knees to chest without pain.  Feet:     Right foot:     Protective Sensation: 3 sites tested. 3 sites sensed.     Left foot:     Protective Sensation: 3 sites tested. 3 sites sensed.  Skin:    General: Skin is warm and dry.          Comments: Two centimeters skin tear of the right forearm without  active bleeding.  Neurological:     Mental Status: She is alert.     GCS: GCS eye subscore is 4. GCS verbal subscore is 5. GCS motor subscore is 6.     Comments: Speech is clear and goal oriented, follows commands Major Cranial nerves without deficit, no facial droop Moves extremities without ataxia, coordination intact  Psychiatric:        Behavior: Behavior normal.     ED Treatments / Results  Labs (all labs ordered are listed, but only abnormal results are displayed) Labs Reviewed  CBC WITH DIFFERENTIAL/PLATELET - Abnormal; Notable for the following components:      Result Value   RBC 3.56 (*)    Hemoglobin 11.7 (*)    MCV 102.8 (*)    All other components within normal limits  COMPREHENSIVE METABOLIC PANEL - Abnormal; Notable for the following components:   CO2 20 (*)    BUN 44 (*)    Creatinine, Ser 1.92 (*)    GFR calc non Af Amer 22 (*)    GFR calc Af Amer 26 (*)    All other components within normal limits  URINALYSIS, ROUTINE W REFLEX MICROSCOPIC - Abnormal; Notable for the following components:   Color, Urine STRAW (*)    Protein, ur 30 (*)    Bacteria, UA RARE (*)    All other components within normal limits    EKG None  Radiology Ct Head Wo Contrast  Result Date: 01/17/2019 CLINICAL DATA:  Fall, head trauma. Forehead bruising EXAM: CT HEAD WITHOUT CONTRAST TECHNIQUE: Contiguous axial images were obtained from the base of the skull through the vertex without intravenous  contrast. COMPARISON:  03/04/2017 FINDINGS: Brain: No evidence of acute infarction, hemorrhage, hydrocephalus, extra-axial collection or mass lesion/mass effect. Scattered low-density changes within the periventricular and subcortical white matter compatible with chronic microvascular ischemic change. Moderate diffuse cerebral volume loss. Vascular: Mild atherosclerotic calcifications involving the large vessels of the skull base. No unexpected hyperdense vessel. Skull: Normal. Negative for fracture or focal lesion. Sinuses/Orbits: Partial opacification of the left sphenoid sinus. Remaining paranasal sinuses are clear. Orbital structures intact. Other: Small scalp hematoma overlies the right frontal bone. IMPRESSION: 1. No acute intracranial abnormality. 2. Small right frontal scalp hematoma. 3. Left sphenoid sinusitis. 4. Stable chronic microvascular ischemic change and cerebral volume loss. Electronically Signed   By: Duanne Guess M.D.   On: 01/17/2019 16:06   Ct Cervical Spine Wo Contrast  Result Date: 01/17/2019 CLINICAL DATA:  Neck pain. Forehead bruising EXAM: CT CERVICAL SPINE WITHOUT CONTRAST TECHNIQUE: Multidetector CT imaging of the cervical spine was performed without intravenous contrast. Multiplanar CT image reconstructions were also generated. COMPARISON:  05/10/2018 FINDINGS: Alignment: Grade 1 anterolisthesis C2 on C3, unchanged. Basion dens interval is upper limits of normal at 8 mm, likely related to slight extension of the upper cervical spine. Normal atlantodental interval. No soft tissue swelling at the craniocervical junction to suggest acute injury. Skull base and vertebrae: No acute fracture. No primary bone lesion or focal pathologic process. Calcification of the transverse ligament. Soft tissues and spinal canal: No prevertebral fluid or swelling. No visible canal hematoma. Disc levels: Multilevel degenerative changes throughout the cervical spine with disc height loss as well as  degenerative facet and uncovertebral arthropathy. No interval change compared to the prior study. No CT evidence of high-grade canal stenosis. Upper chest: Chronic biapical pleuroparenchymal scarring. Other: None. IMPRESSION: 1. No acute cervical spine fracture or posttraumatic listhesis. 2. Multilevel degenerative changes of the  cervical spine with unchanged grade 1 anterolisthesis C2 on C3. Electronically Signed   By: Duanne GuessNicholas  Plundo M.D.   On: 01/17/2019 16:02    Procedures Procedures (including critical care time)  Medications Ordered in ED Medications  Tdap (BOOSTRIX) injection 0.5 mL (0.5 mLs Intramuscular Given 01/17/19 1932)  acetaminophen (TYLENOL) tablet 650 mg (650 mg Oral Given 01/17/19 1933)     Initial Impression / Assessment and Plan / ED Course  I have reviewed the triage vital signs and the nursing notes.  Pertinent labs & imaging results that were available during my care of the patient were reviewed by me and considered in my medical decision making (see chart for details).        Patient well-appearing no acute distress.  Bruise of the right forehead and skin tear of the right forearm.  No other injuries noted today.  No sign of significant injury to the neck, chest, back or abdomen.  Hips stable, patient moves all 4 extremities with equal strength and without pain.  Will obtain CT head and C-spine.  Additionally patient with recurrent falls over the past month.  Will obtain basic blood work as well as urinalysis to assess for possible UTI as cause of fall for this elderly female. - CT Head:  IMPRESSION:  1. No acute intracranial abnormality.  2. Small right frontal scalp hematoma.  3. Left sphenoid sinusitis.  4. Stable chronic microvascular ischemic change and cerebral volume  loss.   CT Cspine:  IMPRESSION:  1. No acute cervical spine fracture or posttraumatic listhesis.  2. Multilevel degenerative changes of the cervical spine with  unchanged grade 1  anterolisthesis C2 on C3.   CBC nonacute CMP with creatinine 1.92 slightly elevated from baseline at 1.7 Urinalysis with 30 protein, rare bacteria, 0-5 WBCs, negative leukocyte, negative nitrite, patient without urinary symptoms doubt UTI at this time.  Patient's granddaughter Yvonne Beck is at bedside.  Tammy reports that she lives with the patient full-time and helps take care of her needs.  Reports that fall happened today when Tammy walked outside to get the leaf blower.  She reports patient has intermittently had home health aides in the past but since she has moved and they have not had this need.  He reports this is the second fall in the last month.  She feels comfortable taking care of the patient after discharge today but would like evaluation for possible future home health aide. - Patient reassessed sleeping no acute distress easily arousable to voice.  Patient and granddaughter updated on care plan and are agreeable for discharge.  Advised consistent wound care for the skin tear on the right forearm and PCP follow-up.  Nonstick dressing applied by nursing staff.  Patient and granddaughter advised of incidental findings today and to follow-up with PCP.  Patient noted to be elevated blood pressure today, I advised compliance with home medications and follow-up with PCP this week for blood pressure recheck and medication management.  Advised on signs/symptoms of hypertensive urgency/emergency and to return to emergency department immediately if they occur.  At this time there does not appear to be any evidence of an acute emergency medical condition and the patient appears stable for discharge with appropriate outpatient follow up. Diagnosis was discussed with patient and Tammy who verbalizes understanding of care plan and is agreeable to discharge. I have discussed return precautions with patient and Tammy who verbalizes understanding of return precautions. Patient encouraged to follow-up with their  PCP. All questions answered.  Patient's case discussed with Dr. Wilson Singer who agrees with plan to discharge with follow-up.   Note: Portions of this report may have been transcribed using voice recognition software. Every effort was made to ensure accuracy; however, inadvertent computerized transcription errors may still be present. Final Clinical Impressions(s) / ED Diagnoses   Final diagnoses:  Fall, initial encounter  Injury of head, initial encounter  Elevated blood pressure reading  Skin tear of right forearm without complication, initial encounter    ED Discharge Orders         [place]     01/17/19 2106    Face-to-face encounter (required for Medicare/Medicaid patients)    Comments: [place] certify that this patient is under my care and that I, or a nurse practitioner or physician's assistant working with me, had a face-to-face encounter that meets the physician face-to-face encounter requirements with this patient on 01/17/2019. The encounter with the patient was in whole, or in part for the following medical condition(s) which is the primary reason for home health care (List medical condition):   Elderly female with recurrent falls.  Granddaughter feels safe caring for patient at home however is requesting home health evaluation for possible aide.   01/17/19 2106           Deliah Boston, PA-C 01/17/19 2120    Virgel Manifold, MD 01/18/19 [phone]

## 2019-01-17 NOTE — ED Triage Notes (Signed)
Pt was at home and stumbled to the floor hitting right side of head without LOC.  No unilateral weakness noted.  Multiple falls per family.  Bruising to right forehead and c/o headache

## 2019-02-05 NOTE — Patient Outreach (Addendum)
Received a referral from [facility] Dr. Donovan Kail   Patient has [employer] insurance, This referral has been transferred to HTA CM through email address [email or web address] per workflow.

## 2019-02-08 ENCOUNTER — Other Ambulatory Visit: Payer: Self-pay

## 2019-02-08 NOTE — Patient Outreach (Signed)
[facility]) Care Management  02/08/2019  KAETLYN NOA Nov 20, 1923 [id number]   Opened in error

## 2019-02-08 NOTE — Patient Outreach (Addendum)
[facility]) Care Management  02/08/2019  Yvonne Beck [birth year] [id number]   Referral received from Duwayne Heck, PT with [facility].  Requested to contact patient regarding need for personal care services as patient was brought home from SNF due to [place]. Referral indicated daughter, Hiilei Gerst, as primary contact but number was out of service. Was able to connect with patient's son/HC POA.  Informed son that patient's insurance does not cover cost of personal care services.  Discussed applying for Medicaid but son stated that she is over the income limit. Son expressed frustration that services are not covered by Medicare.   Informed son about respite program through [employer] [facility] and offered to provide contact information so that he may call to inquire about eligibility.  Son stated "there is no reason to go through all of that."  Attempted to provide more information about how this could be of benefit to patient and family if she is eligible but he abruptly ended call.  Closing case at this time.    Ronn Melena, BSW Social Worker [phone]

## 2019-03-05 ENCOUNTER — Telehealth: Payer: Self-pay

## 2019-03-05 NOTE — Telephone Encounter (Signed)
Phone call placed to patient's son to schedule visit with Palliative Care. Busy signal received

## 2019-03-30 ENCOUNTER — Other Ambulatory Visit: Payer: Self-pay

## 2019-03-30 ENCOUNTER — Other Ambulatory Visit: Payer: PPO | Admitting: Internal Medicine

## 2019-03-30 DIAGNOSIS — F0390 Unspecified dementia without behavioral disturbance: Secondary | ICD-10-CM | POA: Diagnosis not present

## 2019-03-30 DIAGNOSIS — Z515 Encounter for palliative care: Secondary | ICD-10-CM

## 2019-03-30 NOTE — Progress Notes (Addendum)
Dec 29th, 2020 [facility] Palliative Care Consult Note Telephone: [phone]  Fax: [phone]  PATIENT NAME: Yvonne Beck DOB: 1923/11/18 MRN: [id number] [address] [place] [address]  PRIMARY CARE PROVIDER:   Richmond Campbell., PA-C  REFERRING PROVIDER:  Richmond Campbell., PA-C [address],  [place] [address]  RESPONSIBLE PARTY: (son) Billey Gosling [phone], (granddaughter) [phone]  ASSESSMENT / RECOMMENDATIONS:  1. Advance Care Planning: A. Directives: MOST (available in EPIC): DNR/DNI. Full Scope of medical interventions., yes to antibiotics, no to IVFs, no to tube feedings.  B. Goals of Care: would like to have patient walk around the circle outside. Tammy would like to have a podiatrist come to the home and clip patient's toe nails.  2. Cognitive / Functional status: FAST 6e. Oriented to self. PCG granddaughter Tammy reports patient's speech waxes and wanes; able to speak in clear sentences, but sometimes garbled/nonsensical. Dozes a lot during the day; awake about 10 hours/24. Patient was unable to tell me her granddaughter's name. Able to follow simple commands and is pleasantly conversant and on topic. Poor safety awareness; has wandered outside the home in the past. Able to independently transfer; ambulates with walker with unsteady gait. Needs assist with hygiene, dressing, and toileting. Continent of bowel but not of bladder. Current weight 109.4lbs. At a height 5'4" her BMI Is 18.8 kg/m2. She's lost about 6 lbs over the last year. Consumes 1000% of her meals but needing increased cuing; sometimes forgets how to use eating utensils. Taking longer to eat. Tammy checks daily BP measurements. Recent adjustment down of Hydralizine to bid from tid d/t SBP less than 90. Currently BP's in good range; systolic 120's.   Skin changes of seborrheic keratosis; skin easy tear.  3. Family Supports: Granddaughter Tammy  lives with patient full-time and take care of her needs. Tammy also cares for her dad (ETOH/liver cirrhosis; not a behavioral management threat) in same home. Tammy is hesitant to have outside help d/t cost. She also would be hesitant for on outside person to care for patient. Patient over income for Medicaid eligibility. Family aware insurance will not cover cost of HHA. [facility] visits q month [phone]). Tammy shoulders 24/7 care. She is tearful at times; misses living in her own home and being with her young adult children. However, she feels this is the best option for her grandmother, whom she deeply cares for. Tammy copes by strong faith, and watching TV  4. Follow up Palliative Care Visit: Thu 05/27/2019 at 3pm. Monitor for decline; possible hospice eligibility.   I spent 60 minutes providing this consultation from 11am to noon. More than 50% of the time in this consultation was spent coordinating communication.   HISTORY OF PRESENT ILLNESS:  Yvonne Beck is a 85 y.o.  female with h/o dementia, HTN, anxiety, arthritis, glaucoma, acute metabolic encephalopathy, acute on chronic renal disease stage 3 (baseline creatinine 1.7), near syncope, iron deficiency anemia.  01/15/2019: ER witnessed fall/trip: bruise R forehead/skin tear R forearm. Palliative Care was asked to help address goals of care.   CODE STATUS: DNR  PPS: 50%  HOSPICE ELIGIBILITY/DIAGNOSIS: TBD   PAST MEDICAL HISTORY:  Past Medical History:  Diagnosis Date  . Anxiety   . Arthritis   . Glaucoma, both eyes   . Hypertension     SOCIAL HX:  Social History   Tobacco Use  . Smoking status: Former Games developer  . Smokeless  tobacco: Never Used  Substance Use Topics  . Alcohol use: No    ALLERGIES:  Allergies  Allergen Reactions  . Prednisone Other (See Comments)    Unspecified   . Tramadol Other (See Comments)    Doesn't act her self     PERTINENT MEDICATIONS:  Outpatient Encounter Medications  as of 03/30/2019  Medication Sig  . acetaminophen (TYLENOL) 325 MG tablet Take 2 tablets (650 mg total) by mouth every 6 (six) hours as needed for mild pain or headache.  . docusate sodium (COLACE) 100 MG capsule Take 100 mg by mouth every other day.   . ferrous sulfate 325 (65 FE) MG tablet Take 1 tablet (325 mg total) by mouth 3 (three) times daily after meals. (Patient taking differently: Take 325 mg by mouth 2 (two) times daily with a meal. )  . hydrALAZINE (APRESOLINE) 25 MG tablet Take 1 tablet (25 mg total) by mouth every 8 (eight) hours. (Patient taking differently: Take 25 mg by mouth 2 (two) times daily. )  . latanoprost (XALATAN) 0.005 % ophthalmic solution Place 1 drop into the right eye at bedtime.   . metoprolol tartrate (LOPRESSOR) 25 MG tablet Take 0.5 tablets (12.5 mg total) by mouth 2 (two) times daily.  . timolol (TIMOPTIC-XR) 0.25 % ophthalmic gel-forming Place 1 drop into the right eye daily.   . traMADol (ULTRAM) 50 MG tablet Take 1 tablet (50 mg total) by mouth every 6 (six) hours as needed for severe pain. (Patient not taking: Reported on 05/10/2018)  . [DISCONTINUED] cephALEXin (KEFLEX) 500 MG capsule Take 1 capsule (500 mg total) by mouth every 12 (twelve) hours. (Patient not taking: Reported on 05/10/2018)   No facility-administered encounter medications on file as of 03/30/2019.    PHYSICAL EXAM:   General: NAD, frail appearing, thin. Pleasantly conversant in complete sentences; able to follow simple commands. PE limited to decrease COVID exposure Extremities: no edema, no joint deformities Skin: no rashes exposed skin area Neurological: Weakness but otherwise non-focal  Julianne Handler, NP

## 2019-04-05 ENCOUNTER — Emergency Department (HOSPITAL_COMMUNITY): Payer: PPO

## 2019-04-05 ENCOUNTER — Encounter (HOSPITAL_COMMUNITY): Payer: Self-pay | Admitting: Emergency Medicine

## 2019-04-05 ENCOUNTER — Emergency Department (HOSPITAL_COMMUNITY)
Admission: EM | Admit: 2019-04-05 | Discharge: 2019-04-07 | Disposition: A | Discharge status: Home / Self Care | Payer: PPO | Attending: Emergency Medicine | Admitting: Emergency Medicine

## 2019-04-05 DIAGNOSIS — Z79899 Other long term (current) drug therapy: Secondary | ICD-10-CM | POA: Insufficient documentation

## 2019-04-05 DIAGNOSIS — Z20822 Contact with and (suspected) exposure to covid-19: Secondary | ICD-10-CM | POA: Diagnosis not present

## 2019-04-05 DIAGNOSIS — I129 Hypertensive chronic kidney disease with stage 1 through stage 4 chronic kidney disease, or unspecified chronic kidney disease: Secondary | ICD-10-CM | POA: Diagnosis not present

## 2019-04-05 DIAGNOSIS — R0902 Hypoxemia: Secondary | ICD-10-CM | POA: Diagnosis not present

## 2019-04-05 DIAGNOSIS — Z96641 Presence of right artificial hip joint: Secondary | ICD-10-CM | POA: Diagnosis not present

## 2019-04-05 DIAGNOSIS — Z87891 Personal history of nicotine dependence: Secondary | ICD-10-CM | POA: Diagnosis not present

## 2019-04-05 DIAGNOSIS — R0602 Shortness of breath: Secondary | ICD-10-CM | POA: Diagnosis not present

## 2019-04-05 DIAGNOSIS — Z03818 Encounter for observation for suspected exposure to other biological agents ruled out: Secondary | ICD-10-CM | POA: Diagnosis not present

## 2019-04-05 DIAGNOSIS — G9340 Encephalopathy, unspecified: Secondary | ICD-10-CM | POA: Diagnosis not present

## 2019-04-05 DIAGNOSIS — F0391 Unspecified dementia with behavioral disturbance: Secondary | ICD-10-CM | POA: Insufficient documentation

## 2019-04-05 DIAGNOSIS — R000 Tachycardia, unspecified: Secondary | ICD-10-CM | POA: Diagnosis not present

## 2019-04-05 DIAGNOSIS — N183 Chronic kidney disease, stage 3 unspecified: Secondary | ICD-10-CM | POA: Insufficient documentation

## 2019-04-05 DIAGNOSIS — R4182 Altered mental status, unspecified: Secondary | ICD-10-CM | POA: Diagnosis not present

## 2019-04-05 DIAGNOSIS — I10 Essential (primary) hypertension: Secondary | ICD-10-CM | POA: Diagnosis not present

## 2019-04-05 HISTORY — DX: Unspecified dementia, unspecified severity, without behavioral disturbance, psychotic disturbance, mood disturbance, and anxiety: F03.90

## 2019-04-05 LAB — COMPREHENSIVE METABOLIC PANEL
ALT: 9 U/L (ref 0–44)
AST: 17 U/L (ref 15–41)
Albumin: 3.9 g/dL (ref 3.5–5.0)
Alkaline Phosphatase: 79 U/L (ref 38–126)
Anion gap: 12 (ref 5–15)
BUN: 28 mg/dL — ABNORMAL HIGH (ref 8–23)
CO2: 24 mmol/L (ref 22–32)
Calcium: 9.6 mg/dL (ref 8.9–10.3)
Chloride: 103 mmol/L (ref 98–111)
Creatinine, Ser: 1.61 mg/dL — ABNORMAL HIGH (ref 0.44–1.00)
GFR calc Af Amer: 32 mL/min — ABNORMAL LOW (ref >60)
GFR calc non Af Amer: 27 mL/min — ABNORMAL LOW (ref >60)
Glucose, Bld: 103 mg/dL — ABNORMAL HIGH (ref 70–99)
Potassium: 4.9 mmol/L (ref 3.5–5.1)
Sodium: 139 mmol/L (ref 135–145)
Total Bilirubin: 0.7 mg/dL (ref 0.3–1.2)
Total Protein: 6.9 g/dL (ref 6.5–8.1)

## 2019-04-05 LAB — CBC WITH DIFFERENTIAL/PLATELET
Abs Immature Granulocytes: 0.03 10*3/uL (ref 0.00–0.07)
Basophils Absolute: 0 10*3/uL (ref 0.0–0.1)
Basophils Relative: 0 %
Eosinophils Absolute: 0.1 10*3/uL (ref 0.0–0.5)
Eosinophils Relative: 1 %
HCT: 38.1 % (ref 36.0–46.0)
Hemoglobin: 12.5 g/dL (ref 12.0–15.0)
Immature Granulocytes: 0 %
Lymphocytes Relative: 17 %
Lymphs Abs: 1.4 10*3/uL (ref 0.7–4.0)
MCH: 33.2 pg (ref 26.0–34.0)
MCHC: 32.8 g/dL (ref 30.0–36.0)
MCV: 101.1 fL — ABNORMAL HIGH (ref 80.0–100.0)
Monocytes Absolute: 1.1 10*3/uL — ABNORMAL HIGH (ref 0.1–1.0)
Monocytes Relative: 13 %
Neutro Abs: 5.8 10*3/uL (ref 1.7–7.7)
Neutrophils Relative %: 69 %
Platelets: 210 10*3/uL (ref 150–400)
RBC: 3.77 MIL/uL — ABNORMAL LOW (ref 3.87–5.11)
RDW: 13.2 % (ref 11.5–15.5)
WBC: 8.5 10*3/uL (ref 4.0–10.5)
nRBC: 0 % (ref 0.0–0.2)

## 2019-04-05 LAB — CBG MONITORING, ED: Glucose-Capillary: 95 mg/dL (ref 70–99)

## 2019-04-05 NOTE — ED Provider Notes (Signed)
[facility] Provider Note   CSN: [id number] Arrival date & time: 04/05/19  1710     History Chief Complaint  Patient presents with  . Altered Mental Status    Y[facility] is a 86 y.o. female.  The history is provided by a caregiver and medical records. No language interpreter was used.  Altered Mental Status  Y[facility] is a 86 y.o. female who presents to the Emergency Department complaining of AMS.  Level V caveat due to dementia.  Hx is provided by patient's son after her initial ED arrival.  Son states that since yesterday she suddenly became incoherent at lunch time. She lives at home with her son.  This morning Ms. Yvonne Beck required to be fed for breakfast.  She was miming eating breakfast instead of eating.  Today she requires assistance to stand, ambulate.  She also has increased confusion, did not recognize her son and became combative, which is unusual for her.  Denies recent illness such as fever, cough, N/V/D.    At baseline she needs assistance to shower/toilet.  She can normally feed herself, ambulates with a walker.  She could communicate - son cannot specify fluency.       Past Medical History:  Diagnosis Date  . Anxiety   . Arthritis   . Dementia ([place])   . Glaucoma, both eyes   . Hypertension     Patient Active Problem List   Diagnosis Date Noted  . Memory loss 03/26/2018  . UTI (urinary tract infection) 03/26/2018  . Lower urinary tract infectious disease 03/25/2018  . Acute metabolic encephalopathy 29/51/8841  . Essential hypertension 03/25/2018  . Acute renal failure superimposed on stage 3 chronic kidney disease ([place]) 03/25/2018  . Iron deficiency anemia 03/25/2018  . Fall 03/25/2018  . AKI (acute kidney injury) ([place]) 03/05/2017  . Acute kidney injury ([place]) 03/04/2017  . Near syncope 03/04/2017  . Acute anxiety 03/04/2017  . Uncontrolled hypertension 03/04/2017  . Postoperative anemia due to acute blood loss  02/11/2012  . Avascular necrosis of femoral head ([place]) 02/10/2012    Past Surgical History:  Procedure Laterality Date  . JOINT REPLACEMENT    . TOTAL HIP ARTHROPLASTY  02/10/2012   Procedure: TOTAL HIP ARTHROPLASTY;  Surgeon: Tobi Bastos, MD;  Location: [facility];  Service: Orthopedics;  Laterality: Right;     OB History   No obstetric history on file.     Family History  Problem Relation Age of Onset  . Stroke Sister     Social History   Tobacco Use  . Smoking status: Former Research scientist (life sciences)  . Smokeless tobacco: Never Used  Substance Use Topics  . Alcohol use: No  . Drug use: No    Home Medications Prior to Admission medications   Medication Sig Start Date End Date Taking? Authorizing Provider  acetaminophen (TYLENOL) 325 MG tablet Take 2 tablets (650 mg total) by mouth every 6 (six) hours as needed for mild pain or headache. 03/07/17   Georgette Shell, MD  docusate sodium (COLACE) 100 MG capsule Take 100 mg by mouth every other day.     [provider]  ferrous sulfate 325 (65 FE) MG tablet Take 1 tablet (325 mg total) by mouth 3 (three) times daily after meals. Patient taking differently: Take 325 mg by mouth 2 (two) times daily with a meal.  02/14/12   Constable, Amber, PA-C  hydrALAZINE (APRESOLINE) 25 MG tablet Take 1 tablet (25 mg total)  by mouth every 8 (eight) hours. Patient taking differently: Take 25 mg by mouth 2 (two) times daily.  03/07/17   Alwyn Ren, MD  latanoprost (XALATAN) 0.005 % ophthalmic solution Place 1 drop into the right eye at bedtime.     [provider]  metoprolol tartrate (LOPRESSOR) 25 MG tablet Take 0.5 tablets (12.5 mg total) by mouth 2 (two) times daily. 03/07/17   Alwyn Ren, MD  timolol (TIMOPTIC-XR) 0.25 % ophthalmic gel-forming Place 1 drop into the right eye daily.     [provider]  traMADol (ULTRAM) 50 MG tablet Take 1 tablet (50 mg total) by mouth every 6 (six) hours as needed for  severe pain. Patient not taking: Reported on 05/10/2018 05/26/17   Charlestine Night, PA-C    Allergies    Prednisone and Tramadol  Review of Systems   Review of Systems  All other systems reviewed and are negative.   Physical Exam Updated Vital Signs BP (!) 179/71 (BP Location: Right Arm)   Pulse (!) 58   Temp 98.4 F (36.9 C) (Oral)   Resp 14   SpO2 100%   Physical Exam Vitals and nursing note reviewed.  Constitutional:      Appearance: She is well-developed.  HENT:     Head: Normocephalic and atraumatic.  Cardiovascular:     Rate and Rhythm: Normal rate and regular rhythm.     Heart sounds: No murmur.  Pulmonary:     Effort: Pulmonary effort is normal. No respiratory distress.     Breath sounds: Normal breath sounds.  Abdominal:     Palpations: Abdomen is soft.     Tenderness: There is no abdominal tenderness. There is no guarding or rebound.  Musculoskeletal:        General: No swelling or tenderness.  Skin:    General: Skin is warm and dry.  Neurological:     Mental Status: She is alert.     Comments: Nonverbal.  Does not follow commands.  5/5 grip strength bilaterally. No asymmetry of facial movements.    Psychiatric:     Comments: Appears mildly agitated     ED Results / Procedures / Treatments   Labs (all labs ordered are listed, but only abnormal results are displayed) Labs Reviewed  CBC WITH DIFFERENTIAL/PLATELET - Abnormal; Notable for the following components:      Result Value   RBC 3.77 (*)    MCV 101.1 (*)    Monocytes Absolute 1.1 (*)    All other components within normal limits  COMPREHENSIVE METABOLIC PANEL - Abnormal; Notable for the following components:   Glucose, Bld 103 (*)    BUN 28 (*)    Creatinine, Ser 1.61 (*)    GFR calc non Af Amer 27 (*)    GFR calc Af Amer 32 (*)    All other components within normal limits  SARS CORONAVIRUS 2 (TAT 6-24 HRS)  URINALYSIS, ROUTINE W REFLEX MICROSCOPIC  CBG MONITORING, ED    EKG EKG  Interpretation  Date/Time:  Monday April 05 2019 21:52:16 EST Ventricular Rate:  76 PR Interval:  192 QRS Duration: 74 QT Interval:  380 QTC Calculation: 427 R Axis:   81 Text Interpretation: Normal sinus rhythm Nonspecific ST abnormality Abnormal ECG Confirmed by Tilden Fossa [phone]) on 04/05/2019 10:35:51 PM   Radiology DG Chest 1 View  Result Date: 04/05/2019 CLINICAL DATA:  Shortness of breath. EXAM: CHEST  1 VIEW COMPARISON:  March 25, 2018 FINDINGS: Mild, chronic appearing  increased interstitial lung markings are noted. This is unchanged in severity when compared to the prior study. There is no evidence of a pleural effusion or pneumothorax. The heart size and mediastinal contours are within normal limits. There is marked severity calcification of the aortic arch. Multilevel degenerative changes seen throughout the thoracic spine. IMPRESSION: 1. Findings consistent with COPD. 2. No acute or active cardiopulmonary disease. Electronically Signed   By: Aram Candela M.D.   On: 04/05/2019 19:25   CT Head Wo Contrast  Result Date: 04/05/2019 CLINICAL DATA:  Encephalopathy EXAM: CT HEAD WITHOUT CONTRAST TECHNIQUE: Contiguous axial images were obtained from the base of the skull through the vertex without intravenous contrast. COMPARISON:  01/17/2019 FINDINGS: Brain: There is no mass, hemorrhage or extra-axial collection. There is generalized atrophy without lobar predilection. Hypodensity of the white matter is most commonly associated with chronic microvascular disease. Vascular: No abnormal hyperdensity of the major intracranial arteries or dural venous sinuses. No intracranial atherosclerosis. Skull: The visualized skull base, calvarium and extracranial soft tissues are normal. Sinuses/Orbits: No fluid levels or advanced mucosal thickening of the visualized paranasal sinuses. No mastoid or middle ear effusion. The orbits are normal. IMPRESSION: Generalized atrophy and chronic  microvascular ischemia without acute intracranial abnormality. Electronically Signed   By: Deatra Robinson M.D.   On: 04/05/2019 20:54    Procedures Procedures (including critical care time)  Medications Ordered in ED Medications - No data to display  ED Course  I have reviewed the triage vital signs and the nursing notes.  Pertinent labs & imaging results that were available during my care of the patient were reviewed by me and considered in my medical decision making (see chart for details).    MDM Rules/Calculators/A&P                     Patient with dementia coming from home here for evaluation of change in her mental status and behavior since yesterday. She is uncooperative with neurologic testing. She doesn't symmetrically move bilateral upper extremities and has no weakness of facial muscles. CT head is negative for acute abnormality. Patient care transferred pending urinalysis. Discussed the case with patient's husband, he states that he cannot take her home tonight due to caretaker exhaustion.    Final Clinical Impression(s) / ED Diagnoses Final diagnoses:  None    Rx / DC Orders ED Discharge Orders    None       Tilden Fossa, MD 04/06/19 [phone]

## 2019-04-05 NOTE — ED Triage Notes (Signed)
Pt to ED via [facility] from home.  Family reports pt has dementia and has had increase in her mental status over past 2 days.  EMS reports pt was somewhat combative and they gave pt Haldol 5mg  IM.  Pt resting at this time with eyes closed

## 2019-04-05 NOTE — ED Notes (Signed)
[phone] pts granddaughter Virl Son called for update

## 2019-04-06 ENCOUNTER — Telehealth: Payer: Self-pay | Admitting: *Deleted

## 2019-04-06 ENCOUNTER — Other Ambulatory Visit: Payer: Self-pay

## 2019-04-06 LAB — SARS CORONAVIRUS 2 (TAT 6-24 HRS): SARS Coronavirus 2: NEGATIVE

## 2019-04-06 MED ORDER — HYDRALAZINE HCL 25 MG PO TABS
25.0000 mg | ORAL_TABLET | Freq: Once | ORAL | Status: AC
  Administered 2019-04-06: 25 mg via ORAL
  Filled 2019-04-06: qty 1

## 2019-04-06 MED ORDER — METOPROLOL TARTRATE 25 MG PO TABS
12.5000 mg | ORAL_TABLET | Freq: Once | ORAL | Status: AC
  Administered 2019-04-06: 12.5 mg via ORAL
  Filled 2019-04-06: qty 1

## 2019-04-06 NOTE — ED Provider Notes (Signed)
Home bp meds ordered. Notified that home health arrangements have been made.   [facility] called for home transport.   Linwood Dibbles, MD 04/06/19 2235

## 2019-04-06 NOTE — Care Management (Signed)
[facility] has been called for transport. 

## 2019-04-06 NOTE — Progress Notes (Addendum)
[facility]  Referral received for home hospice.  Attempted to reach granddaughter, left contact information and requested call back to assist with discharge.  Will continue to attempt to reach out to assist with d/c planning.  Wallis Bamberg RN, BSN, CCRN In [place]  **spoke with son @ [phone], he is very overwhelmed at the thought of taking her back home.  Informed him that there is not a reason to admit her and updated him on the capacity of the ED.  He is agreeable to seeing if she is hospice eligible.  I will need to have my MD approve her eligibility.  [facility] will then order DME and update TOC manager when transportation home can be arranged.  If there are any needed comfort/anxiety prescriptions that may be needed, please arrange prior to her d/c.  [facility] will see her in the morning and arranging the prescriptions would help ensure that there is no lapse in her symptom management/possibility for hospital return.  **1145, approved for hospice services at home.  DME to be ordered.

## 2019-04-06 NOTE — ED Notes (Signed)
Secretary Dee to follow up with transport.

## 2019-04-06 NOTE — Telephone Encounter (Signed)
EDCM contacted pt son, Billey Gosling for disposition needs.  Billey Gosling states pt may return home after the delivery of hospital bed today.  He will inform us of delivery time so that [facility] may be arranged for transportation home.

## 2019-04-06 NOTE — Discharge Planning (Signed)
EDCM contacted [facility] NP Holly Bodily for disposition assistance.  Corrie Dandy suggested an evaluation by [facility] Liaison for evaluation for possible home hospice care as pt currently has home palliative care.  [facility] consulted Wallis Bamberg, RN to complete evaluation for home hospice.  [facility] will update team with disposition plan.

## 2019-04-06 NOTE — ED Notes (Signed)
Unable to obtain accurate blood pressure after multiple attempts. Pt is agitated, kicking, scratching and screaming as we attempt to obtain BP. Dr. Judd Lien has been notified.

## 2019-04-06 NOTE — ED Provider Notes (Signed)
Care assumed from Dr. Madilyn Hook at shift change.  Patient brought by husband for evaluation of behavioral changes.  Patient has developed dementia-like symptoms in the recent past that have made her difficult to deal with at home.  Her husband states he absolutely, positively cannot take her home.  Patient has no abnormal findings in her work-up thus far that would indicate a correctable physical condition that would require admission.  Patient has been observed overnight awaiting consultation with case management.  Patient may require long-term placement.  She did become agitated at 1 point, but has otherwise remained stable.  Care to be signed out again to oncoming provider awaiting case management/social work evaluation.   Geoffery Lyons, MD 04/06/19 [phone]

## 2019-04-06 NOTE — Discharge Planning (Signed)
PT son states bed is being delivered now and he should be ready for pt arrival in about 1 hour.  EDCM will update the team.

## 2019-04-06 NOTE — ED Notes (Signed)
Both Tech's changed the Pt and she had at least 1 occurrence of Urine

## 2019-04-06 NOTE — ED Notes (Signed)
Ordered pt a dinner tray 

## 2019-04-06 NOTE — ED Notes (Signed)
Updated family in regards to pt's discharge. Pt is number 5 on the list. Family confirmed address and ready for pt's arrival

## 2019-04-06 NOTE — ED Notes (Signed)
Pt has dinner tray at bedside. 

## 2019-04-06 NOTE — Care Management (Signed)
ED CM contacted [facility] for ETA informed patient is 6th on list updated patient's son Billey Gosling patient  still awaits transport by [facility]. Son verbalizes understanding.

## 2019-04-07 DIAGNOSIS — R4182 Altered mental status, unspecified: Secondary | ICD-10-CM | POA: Diagnosis not present

## 2019-04-07 DIAGNOSIS — M2550 Pain in unspecified joint: Secondary | ICD-10-CM | POA: Diagnosis not present

## 2019-04-07 DIAGNOSIS — Z7401 Bed confinement status: Secondary | ICD-10-CM | POA: Diagnosis not present

## 2019-04-07 DIAGNOSIS — I10 Essential (primary) hypertension: Secondary | ICD-10-CM | POA: Diagnosis not present

## 2019-04-07 DIAGNOSIS — R404 Transient alteration of awareness: Secondary | ICD-10-CM | POA: Diagnosis not present

## 2019-04-07 NOTE — ED Notes (Signed)
Discharge instructions discussed with Son. No questions at this time. Pt to go home via [facility]. Family informed and awaiting pt's arrival.

## 2019-05-05 IMAGING — CT CT CERVICAL SPINE W/O CM
5 of 8 series · 11 of 33 positions shown, 12 images · non-contrast
Comparison: 03/25/2018

CLINICAL DATA: Fall, hit back of head.

EXAM:
CT HEAD WITHOUT CONTRAST
CT CERVICAL SPINE WITHOUT CONTRAST
TECHNIQUE: Multidetector CT imaging of the head and cervical spine was
performed following the standard protocol without intravenous
contrast. Multiplanar CT image reconstructions of the cervical spine
were also generated.

[Series 5: head bone · axial · 0.39mm/px · z∈[-49,+7]mm · 2 of 85 slices shown]
[im 29/85  bone]
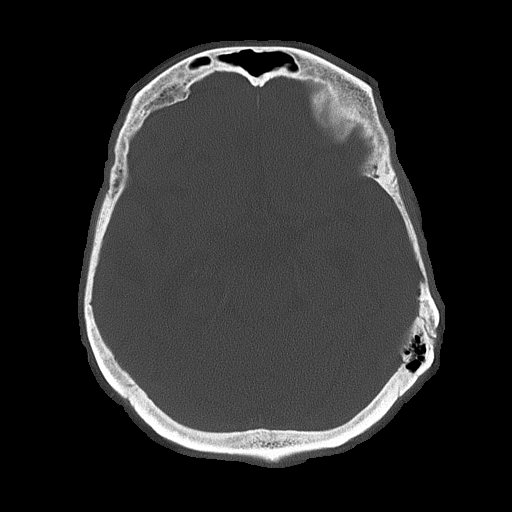
[im 57/85  bone]
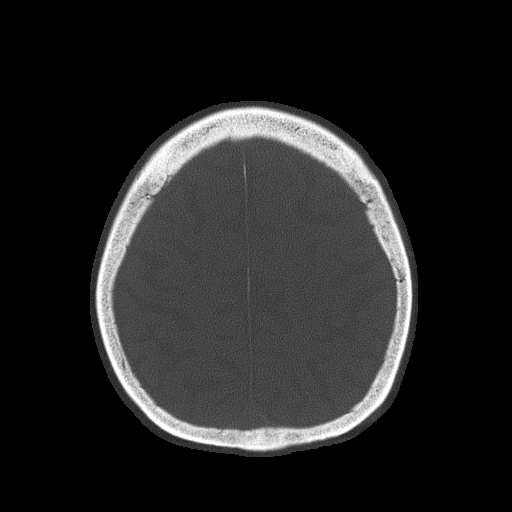

[Series 8: c_spine 2.0 st · axial · 0.39mm/px · z∈[-204,-136]mm · 2 of 104 slices shown, 3 images]
[im 35/104  soft-tissue]
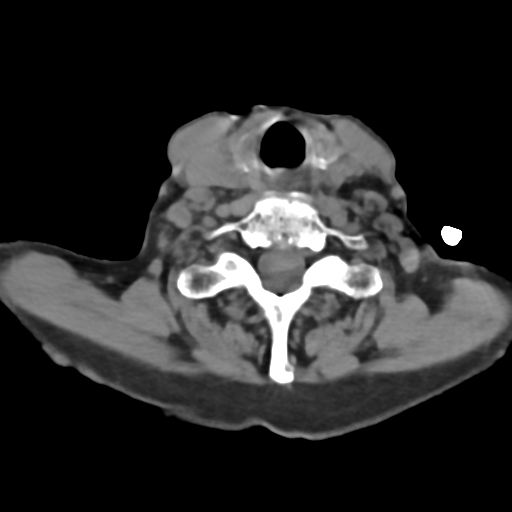
[im 35/104  bone]
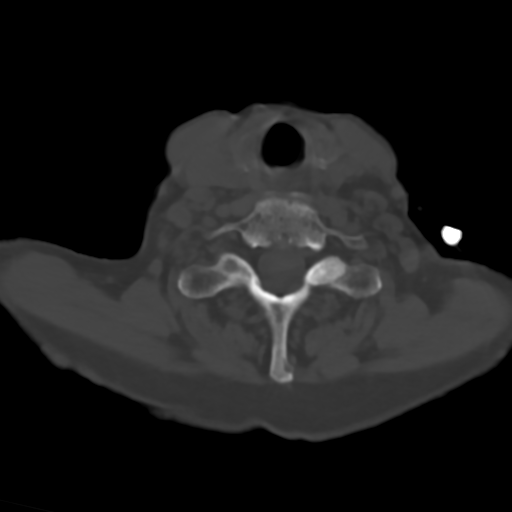
[im 69/104  bone]
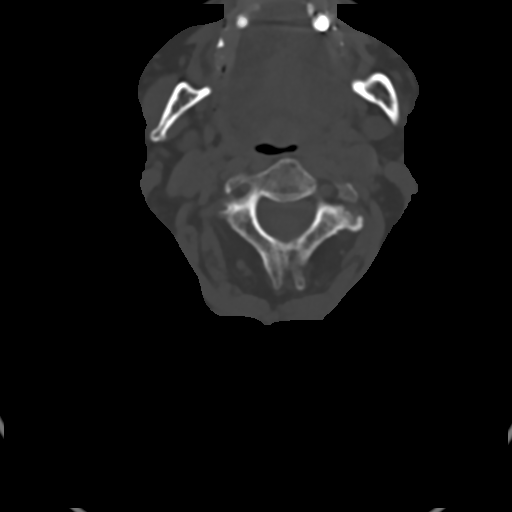

[Series 10: c_spine 2.0 sag bone · sagittal · 0.30mm/px · 4 of 61 slices shown]
[im 13/61  bone]
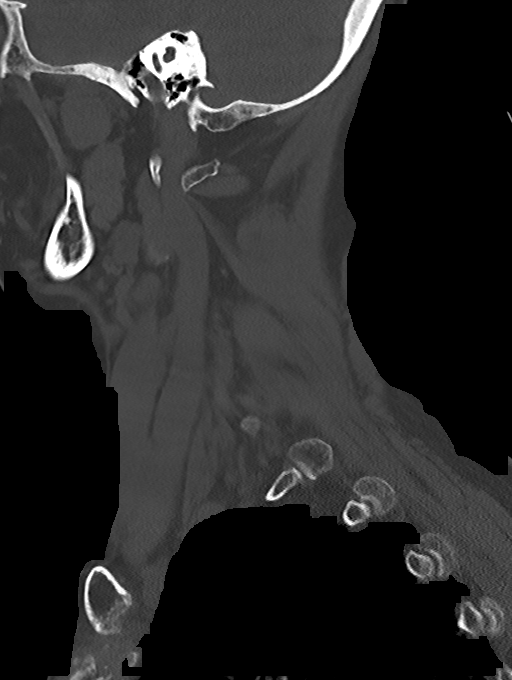
[im 25/61  bone]
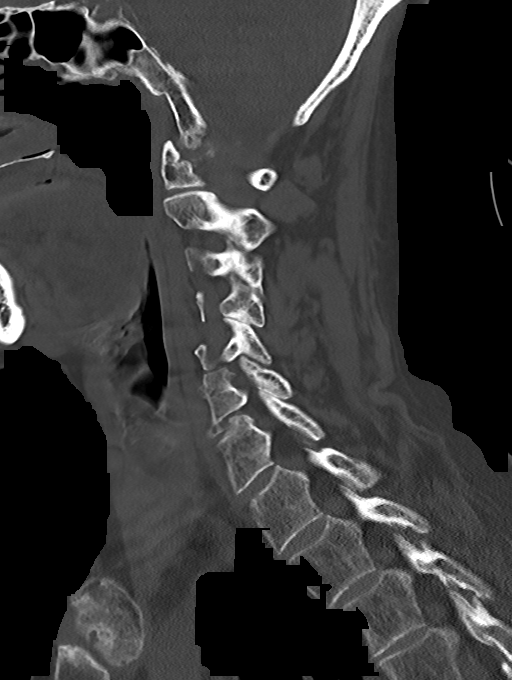
[im 37/61  bone]
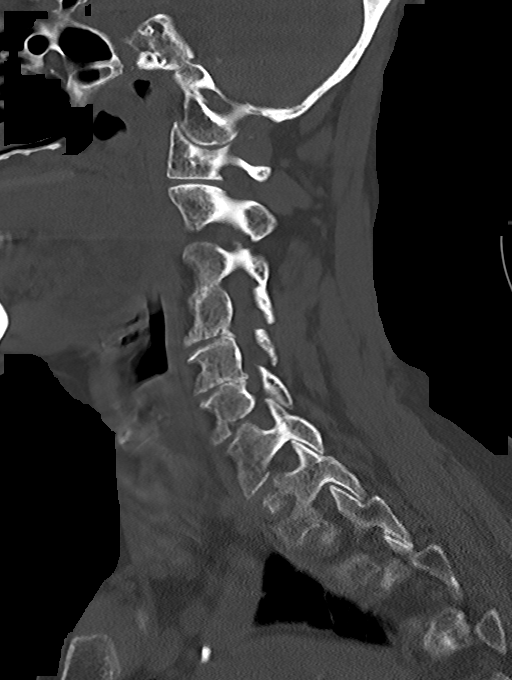
[im 49/61  bone]
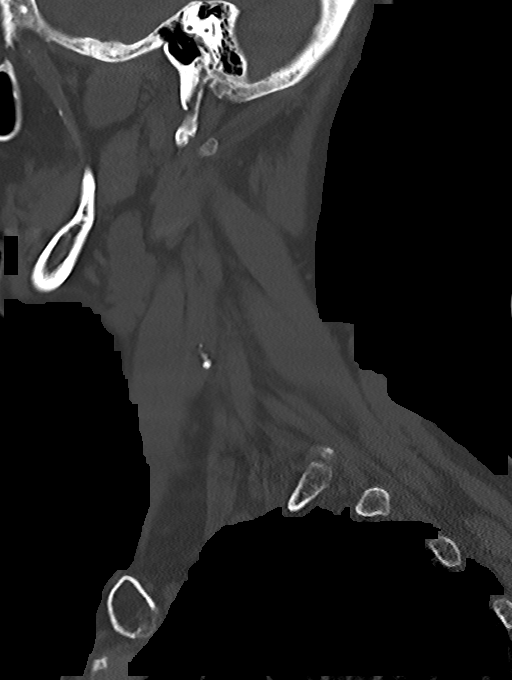

[Series 11: c_spine 2.0 cor bone · coronal · 0.30mm/px · 1 of 61 slices shown]
[im 31/61  bone]
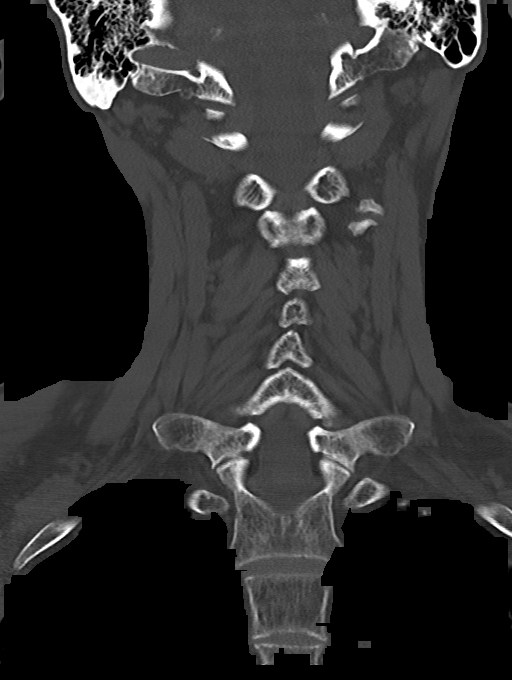

[Series 12: c_spine 2.0 orthogonals · axial · 0.21mm/px · z∈[-239,-169]mm · 2 of 105 slices shown]
[im 35/105  bone]
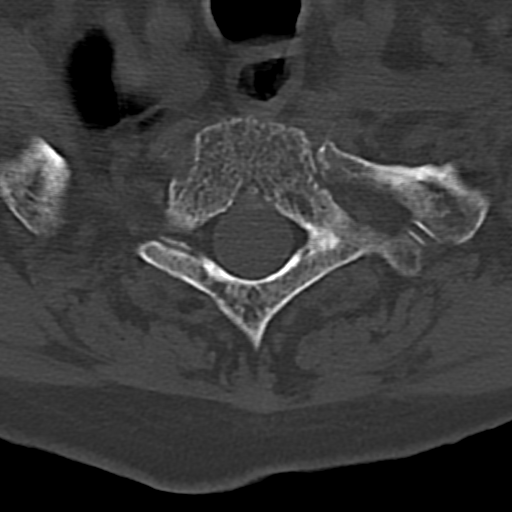
[im 70/105  bone]
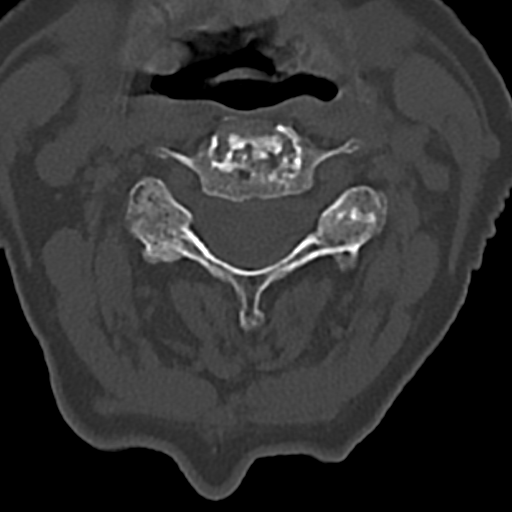

[11 of 33 positions shown; findings below may reference images not displayed]

FINDINGS: CT HEAD FINDINGS

Brain: There is atrophy and chronic small vessel disease changes. No
acute intracranial abnormality. Specifically, no hemorrhage,
hydrocephalus, mass lesion, acute infarction, or significant
intracranial injury.

Vascular: No hyperdense vessel or unexpected calcification.

Skull: No acute calvarial abnormality.

Sinuses/Orbits: Visualized paranasal sinuses and mastoids clear.
Orbital soft tissues unremarkable.

Other: Soft tissue swelling in the high scalp.

CT CERVICAL SPINE FINDINGS

Alignment: No subluxation.

Skull base and vertebrae: No acute fracture. No primary bone lesion
or focal pathologic process.

Soft tissues and spinal canal: No prevertebral fluid or swelling. No
visible canal hematoma.

Disc levels:  Diffuse degenerative disc and facet disease.

Upper chest: Biapical scarring.

Other: None
IMPRESSION: Diffuse cerebral atrophy.

No acute bony abnormality in the cervical spine.

## 2019-07-31 DEATH — deceased

## 2020-01-12 IMAGING — CT CT CERVICAL SPINE W/O CM
3 of 4 series · 13 of 33 positions shown, 16 images · non-contrast
Comparison: 05/10/2018

CLINICAL DATA: Neck pain. Forehead bruising

EXAM:
CT CERVICAL SPINE WITHOUT CONTRAST
TECHNIQUE: Multidetector CT imaging of the cervical spine was performed without
intravenous contrast. Multiplanar CT image reconstructions were also
generated.

[Series 4: c_spine 2.0 st · axial · 0.29mm/px · z∈[-236,-112]mm · 5 of 94 slices shown, 7 images]
[im 16/94  soft-tissue]
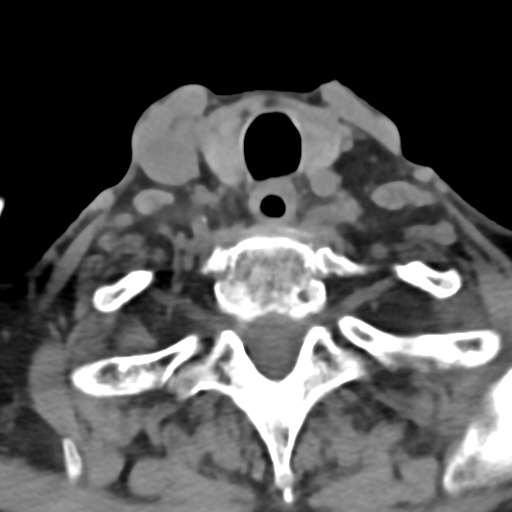
[im 16/94  bone]
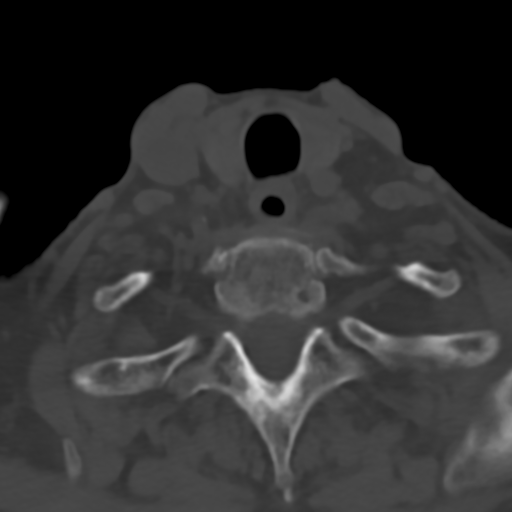
[im 32/94  bone]
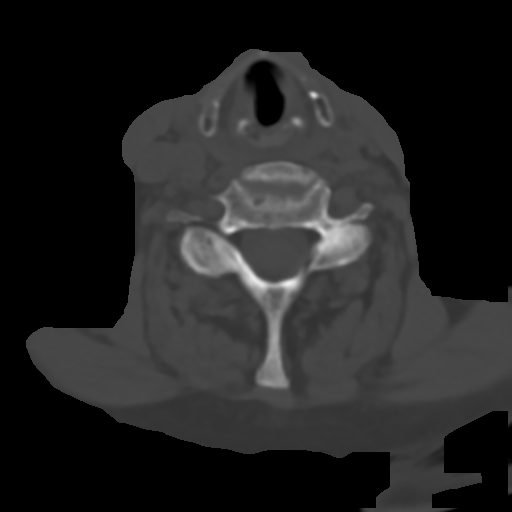
[im 47/94  bone]
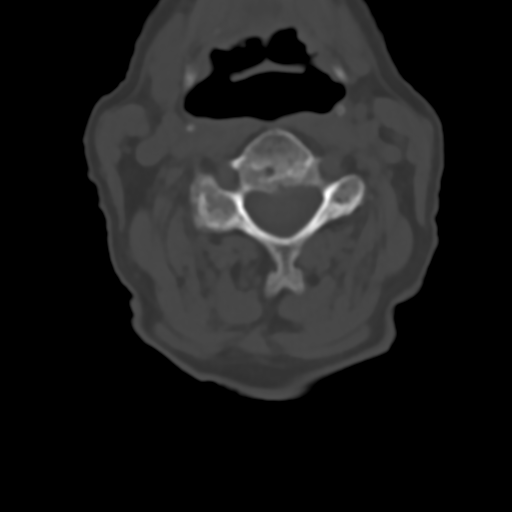
[im 63/94  bone]
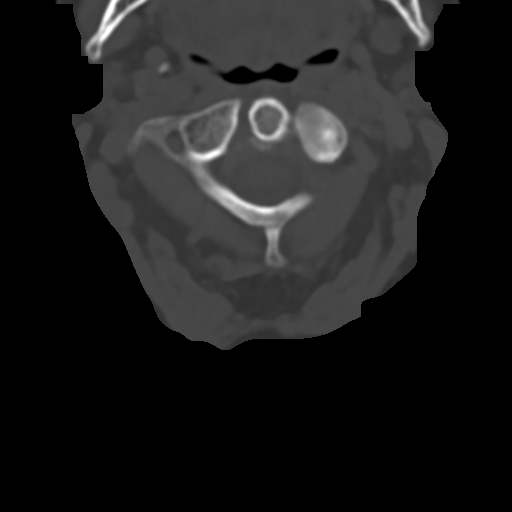
[im 78/94  soft-tissue]
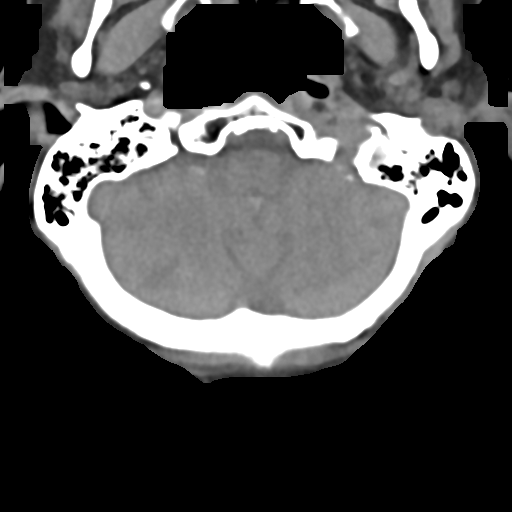
[im 78/94  bone]
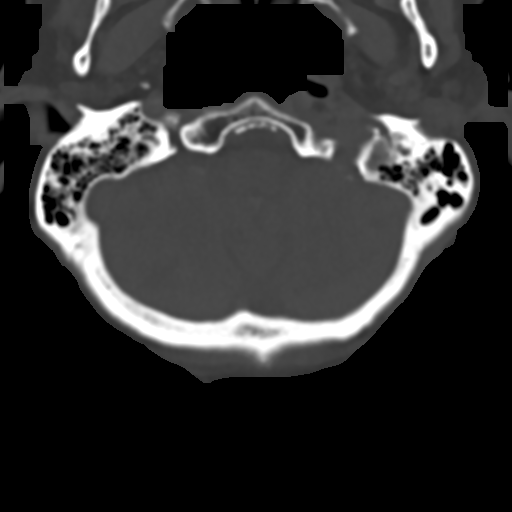

[Series 6: c_spine 2.0 sag bone · sagittal · 0.34mm/px · 5 of 43 slices shown, 6 images]
[im 15/43  bone]
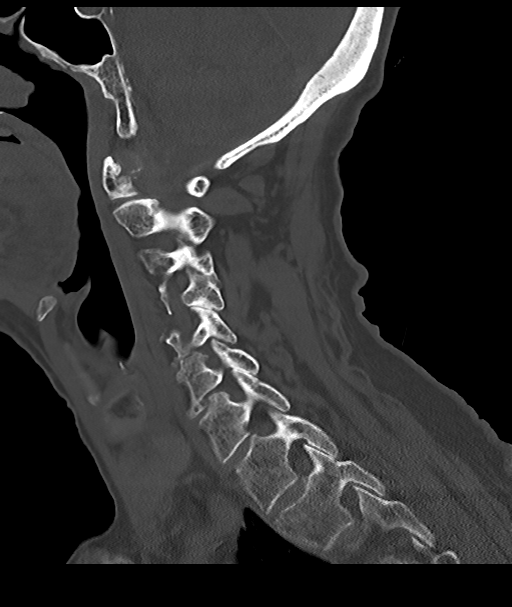
[im 18/43  bone]
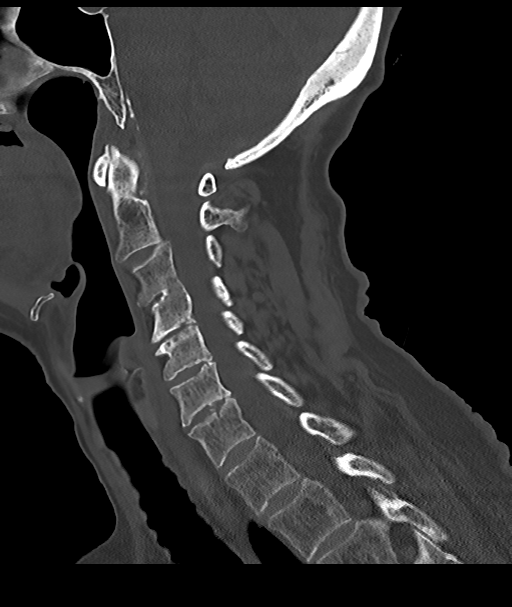
[im 22/43  soft-tissue]
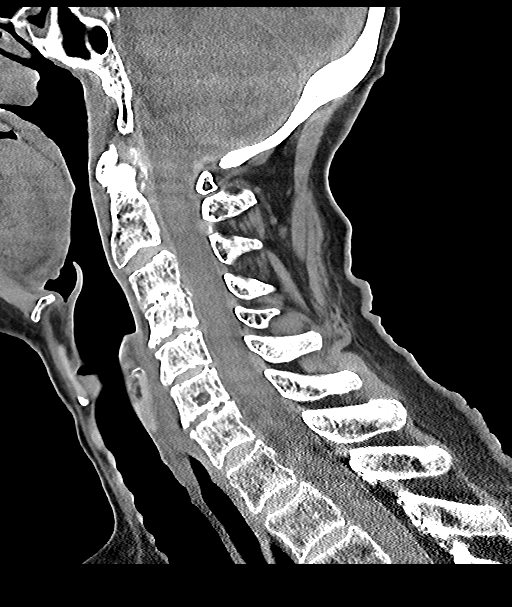
[im 22/43  bone]
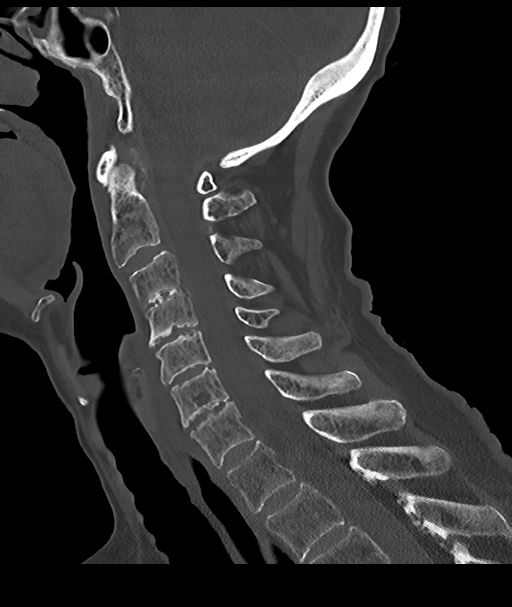
[im 25/43  bone]
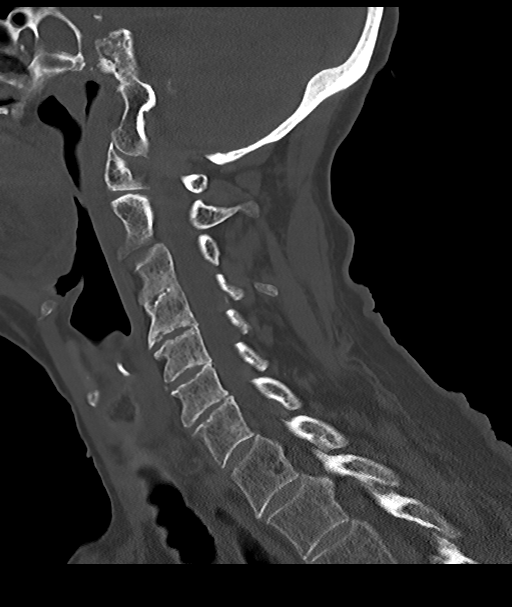
[im 29/43  bone]
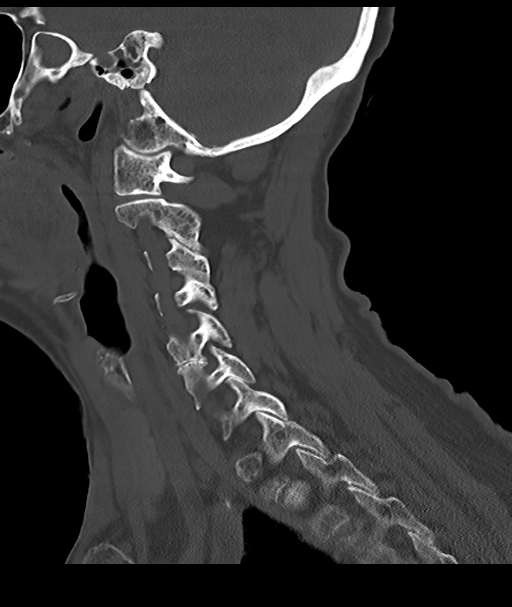

[Series 7: c_spine 2.0 cor bone · coronal · 0.30mm/px · 3 of 53 slices shown]
[im 11/53  bone]
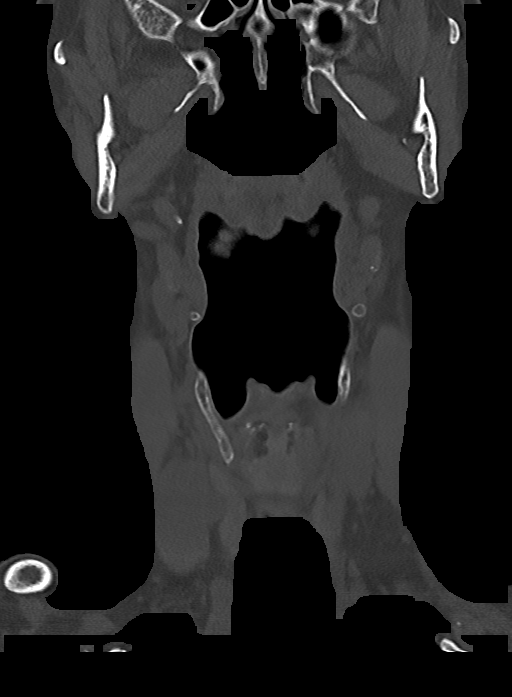
[im 21/53  bone]
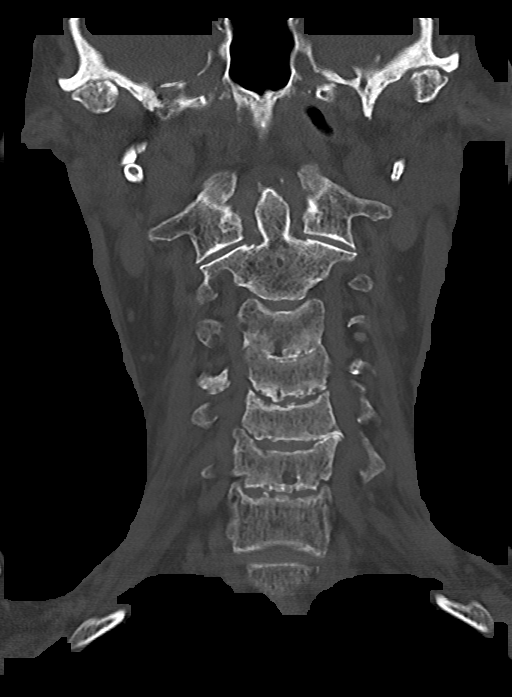
[im 32/53  bone]
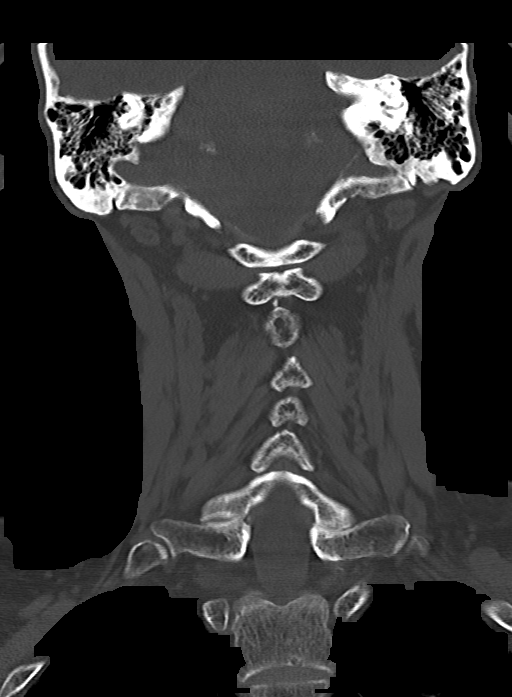

[13 of 33 positions shown; findings below may reference images not displayed]

FINDINGS: Alignment: Grade 1 anterolisthesis C2 on C3, unchanged. Basion dens
interval is upper limits of normal at 8 mm, likely related to slight
extension of the upper cervical spine. Normal atlantodental
interval. No soft tissue swelling at the craniocervical junction to
suggest acute injury.

Skull base and vertebrae: No acute fracture. No primary bone lesion
or focal pathologic process. Calcification of the transverse
ligament.

Soft tissues and spinal canal: No prevertebral fluid or swelling. No
visible canal hematoma.

Disc levels: Multilevel degenerative changes throughout the cervical
spine with disc height loss as well as degenerative facet and
uncovertebral arthropathy. No interval change compared to the prior
study. No CT evidence of high-grade canal stenosis.

Upper chest: Chronic biapical pleuroparenchymal scarring.

Other: None.
IMPRESSION: 1. No acute cervical spine fracture or posttraumatic listhesis.
2. Multilevel degenerative changes of the cervical spine with
unchanged grade 1 anterolisthesis C2 on C3.
